# Patient Record
Sex: Female | Born: 1985
Health system: Southern US, Community
[De-identification: ages and names within clinical notes are randomized; demographics above are authoritative.]

## PROBLEM LIST (undated history)

## (undated) DIAGNOSIS — F419 Anxiety disorder, unspecified: Secondary | ICD-10-CM

## (undated) DIAGNOSIS — F319 Bipolar disorder, unspecified: Secondary | ICD-10-CM

## (undated) DIAGNOSIS — F429 Obsessive-compulsive disorder, unspecified: Secondary | ICD-10-CM

## (undated) DIAGNOSIS — Z789 Other specified health status: Secondary | ICD-10-CM

## (undated) DIAGNOSIS — F32A Depression, unspecified: Secondary | ICD-10-CM

## (undated) DIAGNOSIS — F329 Major depressive disorder, single episode, unspecified: Secondary | ICD-10-CM

## (undated) HISTORY — PX: BREAST ENHANCEMENT SURGERY: SHX7

## (undated) HISTORY — DX: Bipolar disorder, unspecified: F31.9

---

## 2006-05-26 ENCOUNTER — Other Ambulatory Visit: Admission: RE | Admit: 2006-05-26 | Discharge: 2006-05-26 | Payer: Self-pay | Admitting: Obstetrics and Gynecology

## 2006-11-18 ENCOUNTER — Ambulatory Visit (HOSPITAL_COMMUNITY): Admission: RE | Admit: 2006-11-18 | Discharge: 2006-11-18 | Payer: Self-pay | Admitting: Obstetrics and Gynecology

## 2006-12-29 ENCOUNTER — Inpatient Hospital Stay (HOSPITAL_COMMUNITY): Admission: AD | Admit: 2006-12-29 | Discharge: 2006-12-29 | Payer: Self-pay | Admitting: Obstetrics & Gynecology

## 2007-01-07 ENCOUNTER — Inpatient Hospital Stay (HOSPITAL_COMMUNITY): Admission: AD | Admit: 2007-01-07 | Discharge: 2007-01-10 | Payer: Self-pay | Admitting: Obstetrics and Gynecology

## 2009-05-17 ENCOUNTER — Other Ambulatory Visit: Admission: RE | Admit: 2009-05-17 | Discharge: 2009-05-17 | Payer: Self-pay | Admitting: Obstetrics and Gynecology

## 2010-04-17 ENCOUNTER — Inpatient Hospital Stay (HOSPITAL_COMMUNITY): Admission: AD | Admit: 2010-04-17 | Discharge: 2010-04-17 | Payer: Self-pay | Admitting: Obstetrics and Gynecology

## 2010-04-25 ENCOUNTER — Inpatient Hospital Stay (HOSPITAL_COMMUNITY)
Admission: AD | Admit: 2010-04-25 | Discharge: 2010-04-25 | Payer: Self-pay | Source: Home / Self Care | Admitting: Obstetrics & Gynecology

## 2010-06-03 ENCOUNTER — Inpatient Hospital Stay (HOSPITAL_COMMUNITY)
Admission: AD | Admit: 2010-06-03 | Discharge: 2010-06-03 | Payer: Self-pay | Source: Home / Self Care | Admitting: Obstetrics and Gynecology

## 2010-06-27 ENCOUNTER — Inpatient Hospital Stay (HOSPITAL_COMMUNITY)
Admission: AD | Admit: 2010-06-27 | Discharge: 2010-06-28 | Payer: Self-pay | Source: Home / Self Care | Attending: Family Medicine | Admitting: Family Medicine

## 2010-09-08 LAB — CBC
HCT: 29.7 % — ABNORMAL LOW (ref 36.0–46.0)
Hemoglobin: 9.6 g/dL — ABNORMAL LOW (ref 12.0–15.0)
RBC: 3.65 MIL/uL — ABNORMAL LOW (ref 3.87–5.11)
WBC: 11.6 10*3/uL — ABNORMAL HIGH (ref 4.0–10.5)

## 2010-09-10 LAB — URINALYSIS, ROUTINE W REFLEX MICROSCOPIC
Glucose, UA: NEGATIVE mg/dL
Protein, ur: NEGATIVE mg/dL
Specific Gravity, Urine: 1.025 (ref 1.005–1.030)
pH: 6.5 (ref 5.0–8.0)

## 2010-09-10 LAB — URINE MICROSCOPIC-ADD ON

## 2010-09-10 LAB — URINE CULTURE: Colony Count: 2000

## 2010-09-10 LAB — WET PREP, GENITAL
Clue Cells Wet Prep HPF POC: NONE SEEN
Trich, Wet Prep: NONE SEEN

## 2010-11-11 NOTE — Consult Note (Signed)
NAMEMarland Kitchen  Alexis, [name]           ACCOUNT NO.:  [id number]   MEDICAL RECORD NO.:  [id number]          PATIENT TYPE:  MAT   LOCATION:  MATC                          FACILITY:  [facility]   PHYSICIAN:  Lazaro Arms, M.D.   DATE OF BIRTH:  03-26-1986   DATE OF CONSULTATION:  DATE OF DISCHARGE:  12/29/2006                                 CONSULTATION   Alexis [name] is a 25 year old white female, gravida 1, para 0, estimated  delivery of January 05, 2007 currently at [redacted] weeks gestation who presented  to the MAU complaining of dribbling fluid.  She did not have a big gush,  it started this evening.  Has had no notable uterine contractions, no  bleeding, the baby has been moving well for her.  On evaluation at the  MAU, she has dry peroneum, I had her cough several times and there was  no gush of fluid.  I did a sterile speculum exam and again visually  there was pooling again with coughing and pressure.  The amnio swab was  negative and it was Fern negative.  Her cervix was the same as it was  when I checked her in the office the other day which was a fingertip,  about 25% effaced, very posterior and -1 station vertex.  But very soft  and malleable.  External fetal monitor reveals a reactive NST with  minimal uterine activity, just a little bit of irritability which she is  not aware of.  Her blood pressure is a little elevated at 144/76, pulse  of 110.  We are rechecking that prior to discharge.  Otherwise she is  given routine labor instructions and precautions.  We talked about  rupture of membranes and she is encouraged to keep her appointment next  Tuesday as scheduled.  She is to call us if she has any questions in the  meantime.      Lazaro Arms, M.D.  Electronically Signed     LHE/MEDQ  D:  12/29/2006  T:  12/30/2006  Job:  [id number]

## 2010-11-11 NOTE — Op Note (Signed)
NAMEJAQUANA, GEIGER           ACCOUNT NO.:  [id number]   MEDICAL RECORD NO.:  [id number]          PATIENT TYPE:  INP   LOCATION:  9112                          [facility]ACILITY:  [facility]   PHYSICIAN:  Tilda Burrow, M.D. DATE OF BIRTH:  31-Oct-1985   DATE OF PROCEDURE:  01/08/2007  DATE OF DISCHARGE:                               OPERATIVE REPORT   [name] was brought in for induction of labor at 9 p.m., admitted,  _foley bulb___  placed; which was taken out at 3 a.m. and membranes  ruptured.  She was begun on Pitocin and progressed to completely dilated  within 2-1/2 hours at 5:40 a.m.  Delivered after a 22 minute second-  stage, delivering a healthy 7 pound 9 ounce female infant (3445 gram.  Apgars 7 and 9, over an first-degree perineal laceration where the hymen  remnant was disrupted; but otherwise the perineum was intact.  The cord  was clamped.  The infant placed on the perineal abdomen and then taken  to the warmer.  Amniotic fluid was clear without malodor.  The patient  had placenta delivered easily.      Tilda Burrow, M.D.  Electronically Signed     JVF/MEDQ  D:  01/08/2007  T:  01/09/2007  Job:  [id number]

## 2010-11-11 NOTE — H&P (Signed)
NAMESANJUANA, Alexis [name]           ACCOUNT NO.:  [id number]   MEDICAL RECORD NO.:  [id number]          PATIENT TYPE:  INP   LOCATION:  9112                          [facility]ILITY:  [facility]   PHYSICIAN:  Tilda Burrow, M.D. DATE OF BIRTH:  October 04, 1985   DATE OF ADMISSION:  01/07/2007  DATE OF DISCHARGE:                              HISTORY & PHYSICAL   January 07, 2007, 9 p.m.   ADMITTING DIAGNOSES:  1. Pregnancy, 40 and 1/[redacted] weeks gestation.  2. Cervical favorability.  3. Elective induction of labor for social reasons.   HPI:  This 25 year old primiparous female is now 40 weeks 2 days by best  criteria and has cervical favorability at 3 cm, 50%, -2, with a  contraction at 2 cm and posterior between contractions.  She is admitted  at this time for induction of labor as per vigorous patient request due  to academic requirements over the next 2 weeks, final exams, etc.  The  patient understands the risk of induced labors include fetal distress  and need for emergency delivery with similar risks to those accomplished  with spontaneous labors.  Prenatal course has been followed through  [facility] with blood type O positive, urine drug screen  negative, Varicella zoster antibody noted present, rubella immunity  present, hemoglobin 14, hematocrit 41.  Hepatitis, HIV, RPR, GC and  Chlamydia all negative.  Group B Strep negative.  Glucose tolerance test  borderline at 148 mg% with normal 3-hour test.  She is negative for  cystic fibrosis, she desires epidural, has a female infant anticipated.   PHYSICAL EXAM:  A healthy, slim, calm Caucasian female alert, oriented  x3.  Pupils equal, reactive.  NECK:  Supple.  CARDIOVASCULAR:  Unremarkable.  FUNDAL HEIGHT:  Is 35-36 cm.  ESTIMATED FETAL WEIGHT:  Is 6-1/2 pounds.  CERVIX:  Is 3 cm, 50%, -2 with contraction.   PLAN:  Foley bulb for 3-4 hours, until 3 a.m., and vaginal delivery  anticipated after Pitocin induction of labor.      Tilda Burrow, M.D.  Electronically Signed     JVF/MEDQ  D:  01/08/2007  T:  01/09/2007  Job:  [id number]

## 2011-02-12 ENCOUNTER — Inpatient Hospital Stay (INDEPENDENT_AMBULATORY_CARE_PROVIDER_SITE_OTHER)
Admission: RE | Admit: 2011-02-12 | Discharge: 2011-02-12 | Disposition: A | Discharge status: Home / Self Care | Payer: Commercial Managed Care - PPO | Source: Ambulatory Visit | Attending: Emergency Medicine | Admitting: Emergency Medicine

## 2011-02-12 DIAGNOSIS — R112 Nausea with vomiting, unspecified: Secondary | ICD-10-CM

## 2011-04-14 LAB — CBC
Hemoglobin: 11.4 — ABNORMAL LOW
MCV: 92.6
Platelets: 152
Platelets: 171
RDW: 13
WBC: 9.3

## 2011-04-14 LAB — RPR: RPR Ser Ql: NONREACTIVE

## 2011-06-30 NOTE — L&D Delivery Note (Signed)
Delivery Note At 3:04 PM a viable and healthy female was delivered via  (Presentation: LOA.  APGAR: pending , ; weight pending .   Placenta status: , .  Cord:  with the following complications: .  Cord pH: n/a  Anesthesia:  Epidural Episiotomy: None Lacerations: None Suture Repair: N/A Est. Blood Loss (mL): 350  Mom to postpartum.  Baby to nursery-stable.  [facility] 02/17/2012, 3:27 PM

## 2011-07-15 ENCOUNTER — Other Ambulatory Visit (HOSPITAL_COMMUNITY)
Admission: RE | Admit: 2011-07-15 | Discharge: 2011-07-15 | Disposition: A | Discharge status: Home / Self Care | Payer: Commercial Managed Care - PPO | Source: Ambulatory Visit | Attending: Obstetrics and Gynecology | Admitting: Obstetrics and Gynecology

## 2011-07-15 DIAGNOSIS — Z113 Encounter for screening for infections with a predominantly sexual mode of transmission: Secondary | ICD-10-CM | POA: Insufficient documentation

## 2011-07-15 DIAGNOSIS — Z01419 Encounter for gynecological examination (general) (routine) without abnormal findings: Secondary | ICD-10-CM | POA: Insufficient documentation

## 2011-07-15 LAB — OB RESULTS CONSOLE HEPATITIS B SURFACE ANTIGEN: Hepatitis B Surface Ag: NEGATIVE

## 2011-07-15 LAB — OB RESULTS CONSOLE ABO/RH

## 2011-07-15 LAB — OB RESULTS CONSOLE ANTIBODY SCREEN: Antibody Screen: NEGATIVE

## 2011-07-15 LAB — OB RESULTS CONSOLE RUBELLA ANTIBODY, IGM: Rubella: IMMUNE

## 2011-07-15 LAB — OB RESULTS CONSOLE GC/CHLAMYDIA: Gonorrhea: NEGATIVE

## 2011-12-28 ENCOUNTER — Telehealth (HOSPITAL_COMMUNITY): Payer: Self-pay

## 2011-12-29 ENCOUNTER — Ambulatory Visit (HOSPITAL_COMMUNITY): Payer: Commercial Managed Care - PPO | Admitting: Licensed Clinical Social Worker

## 2012-02-10 LAB — OB RESULTS CONSOLE GBS: GBS: NEGATIVE

## 2012-02-17 ENCOUNTER — Inpatient Hospital Stay (HOSPITAL_COMMUNITY)
Admission: AD | Admit: 2012-02-17 | Discharge: 2012-02-18 | DRG: 767 | Disposition: A | Discharge status: Home / Self Care | Payer: 59 | Source: Ambulatory Visit | Attending: Obstetrics & Gynecology | Admitting: Obstetrics & Gynecology

## 2012-02-17 ENCOUNTER — Encounter (HOSPITAL_COMMUNITY): Payer: Self-pay | Admitting: *Deleted

## 2012-02-17 ENCOUNTER — Encounter (HOSPITAL_COMMUNITY): Payer: Self-pay | Admitting: Anesthesiology

## 2012-02-17 ENCOUNTER — Inpatient Hospital Stay (HOSPITAL_COMMUNITY): Payer: 59 | Admitting: Anesthesiology

## 2012-02-17 DIAGNOSIS — Z302 Encounter for sterilization: Secondary | ICD-10-CM

## 2012-02-17 DIAGNOSIS — Z3490 Encounter for supervision of normal pregnancy, unspecified, unspecified trimester: Secondary | ICD-10-CM

## 2012-02-17 HISTORY — DX: Other specified health status: Z78.9

## 2012-02-17 LAB — CBC
HCT: 30.7 % — ABNORMAL LOW (ref 36.0–46.0)
MCHC: 31.3 g/dL (ref 30.0–36.0)
RDW: 15.2 % (ref 11.5–15.5)

## 2012-02-17 MED ORDER — METOCLOPRAMIDE HCL 10 MG PO TABS
10.0000 mg | ORAL_TABLET | Freq: Once | ORAL | Status: AC
  Administered 2012-02-18: 10 mg via ORAL
  Filled 2012-02-17: qty 1

## 2012-02-17 MED ORDER — PRENATAL MULTIVITAMIN CH
1.0000 | ORAL_TABLET | Freq: Every day | ORAL | Status: DC
  Filled 2012-02-17: qty 1

## 2012-02-17 MED ORDER — PHENYLEPHRINE 40 MCG/ML (10ML) SYRINGE FOR IV PUSH (FOR BLOOD PRESSURE SUPPORT): 80.0000 ug | PREFILLED_SYRINGE | INTRAVENOUS | Status: DC | PRN

## 2012-02-17 MED ORDER — DIPHENHYDRAMINE HCL 50 MG/ML IJ SOLN: 12.5000 mg | INTRAMUSCULAR | Status: DC | PRN

## 2012-02-17 MED ORDER — IBUPROFEN 600 MG PO TABS
600.0000 mg | ORAL_TABLET | Freq: Four times a day (QID) | ORAL | Status: DC
  Administered 2012-02-17 – 2012-02-18 (×4): 600 mg via ORAL
  Filled 2012-02-17 (×4): qty 1

## 2012-02-17 MED ORDER — ONDANSETRON HCL 4 MG/2ML IJ SOLN: 4.0000 mg | Freq: Four times a day (QID) | INTRAMUSCULAR | Status: DC | PRN

## 2012-02-17 MED ORDER — LACTATED RINGERS IV SOLN
500.0000 mL | Freq: Once | INTRAVENOUS | Status: AC
  Administered 2012-02-17: 500 mL via INTRAVENOUS

## 2012-02-17 MED ORDER — SIMETHICONE 80 MG PO CHEW: 80.0000 mg | CHEWABLE_TABLET | ORAL | Status: DC | PRN

## 2012-02-17 MED ORDER — PHENYLEPHRINE 40 MCG/ML (10ML) SYRINGE FOR IV PUSH (FOR BLOOD PRESSURE SUPPORT)
80.0000 ug | PREFILLED_SYRINGE | INTRAVENOUS | Status: DC | PRN
  Filled 2012-02-17: qty 5

## 2012-02-17 MED ORDER — FLEET ENEMA 7-19 GM/118ML RE ENEM: 1.0000 | ENEMA | RECTAL | Status: DC | PRN

## 2012-02-17 MED ORDER — FENTANYL CITRATE 0.05 MG/ML IJ SOLN: 100.0000 ug | INTRAMUSCULAR | Status: DC | PRN

## 2012-02-17 MED ORDER — SENNOSIDES-DOCUSATE SODIUM 8.6-50 MG PO TABS
2.0000 | ORAL_TABLET | Freq: Every day | ORAL | Status: DC
  Administered 2012-02-17: 2 via ORAL

## 2012-02-17 MED ORDER — EPHEDRINE 5 MG/ML INJ
10.0000 mg | INTRAVENOUS | Status: DC | PRN
  Administered 2012-02-17: 10 mg via INTRAVENOUS
  Filled 2012-02-17 (×2): qty 4

## 2012-02-17 MED ORDER — EPHEDRINE 5 MG/ML INJ: 10.0000 mg | INTRAVENOUS | Status: DC | PRN

## 2012-02-17 MED ORDER — SODIUM BICARBONATE 8.4 % IV SOLN
INTRAVENOUS | Status: DC | PRN
  Administered 2012-02-17: 4 mL via EPIDURAL

## 2012-02-17 MED ORDER — OXYTOCIN BOLUS FROM INFUSION
250.0000 mL | Freq: Once | INTRAVENOUS | Status: AC
  Administered 2012-02-17: 250 mL via INTRAVENOUS
  Filled 2012-02-17: qty 500

## 2012-02-17 MED ORDER — DIBUCAINE 1 % RE OINT: 1.0000 "application " | TOPICAL_OINTMENT | RECTAL | Status: DC | PRN

## 2012-02-17 MED ORDER — WITCH HAZEL-GLYCERIN EX PADS: 1.0000 "application " | MEDICATED_PAD | CUTANEOUS | Status: DC | PRN

## 2012-02-17 MED ORDER — ACETAMINOPHEN 325 MG PO TABS: 650.0000 mg | ORAL_TABLET | ORAL | Status: DC | PRN

## 2012-02-17 MED ORDER — ONDANSETRON HCL 4 MG/2ML IJ SOLN: 4.0000 mg | INTRAMUSCULAR | Status: DC | PRN

## 2012-02-17 MED ORDER — IBUPROFEN 600 MG PO TABS: 600.0000 mg | ORAL_TABLET | Freq: Four times a day (QID) | ORAL | Status: DC | PRN

## 2012-02-17 MED ORDER — LACTATED RINGERS IV SOLN
INTRAVENOUS | Status: DC
Start: 2012-02-17 — End: 2012-02-17
  Administered 2012-02-17 (×3): via INTRAVENOUS

## 2012-02-17 MED ORDER — OXYTOCIN 40 UNITS IN LACTATED RINGERS INFUSION - SIMPLE MED
62.5000 mL/h | Freq: Once | INTRAVENOUS | Status: DC
  Filled 2012-02-17: qty 1000

## 2012-02-17 MED ORDER — ZOLPIDEM TARTRATE 5 MG PO TABS: 5.0000 mg | ORAL_TABLET | Freq: Every evening | ORAL | Status: DC | PRN

## 2012-02-17 MED ORDER — CITRIC ACID-SODIUM CITRATE 334-500 MG/5ML PO SOLN: 30.0000 mL | ORAL | Status: DC | PRN

## 2012-02-17 MED ORDER — TETANUS-DIPHTH-ACELL PERTUSSIS 5-2.5-18.5 LF-MCG/0.5 IM SUSP: 0.5000 mL | Freq: Once | INTRAMUSCULAR | Status: DC

## 2012-02-17 MED ORDER — OXYTOCIN 40 UNITS IN LACTATED RINGERS INFUSION - SIMPLE MED
1.0000 m[IU]/min | INTRAVENOUS | Status: DC
  Administered 2012-02-17: 2 m[IU]/min via INTRAVENOUS

## 2012-02-17 MED ORDER — PRENATAL MULTIVITAMIN CH: 1.0000 | ORAL_TABLET | Freq: Every day | ORAL | Status: DC

## 2012-02-17 MED ORDER — FENTANYL 2.5 MCG/ML BUPIVACAINE 1/10 % EPIDURAL INFUSION (WH - ANES)
14.0000 mL/h | INTRAMUSCULAR | Status: DC
  Administered 2012-02-17: 14 mL/h via EPIDURAL
  Filled 2012-02-17 (×2): qty 60

## 2012-02-17 MED ORDER — LIDOCAINE HCL (PF) 1 % IJ SOLN
30.0000 mL | INTRAMUSCULAR | Status: DC | PRN
  Filled 2012-02-17: qty 30

## 2012-02-17 MED ORDER — OXYCODONE-ACETAMINOPHEN 5-325 MG PO TABS: 1.0000 | ORAL_TABLET | ORAL | Status: DC | PRN

## 2012-02-17 MED ORDER — FENTANYL 2.5 MCG/ML BUPIVACAINE 1/10 % EPIDURAL INFUSION (WH - ANES)
INTRAMUSCULAR | Status: DC | PRN
  Administered 2012-02-17: 14 mL/h via EPIDURAL

## 2012-02-17 MED ORDER — LACTATED RINGERS IV SOLN
INTRAVENOUS | Status: DC
  Administered 2012-02-18: 10:00:00 via INTRAVENOUS
  Administered 2012-02-18: 10 mL/h via INTRAVENOUS

## 2012-02-17 MED ORDER — DIPHENHYDRAMINE HCL 25 MG PO CAPS: 25.0000 mg | ORAL_CAPSULE | Freq: Four times a day (QID) | ORAL | Status: DC | PRN

## 2012-02-17 MED ORDER — FAMOTIDINE 20 MG PO TABS
40.0000 mg | ORAL_TABLET | Freq: Once | ORAL | Status: AC
  Administered 2012-02-18: 40 mg via ORAL
  Filled 2012-02-17: qty 2

## 2012-02-17 MED ORDER — ONDANSETRON HCL 4 MG PO TABS: 4.0000 mg | ORAL_TABLET | ORAL | Status: DC | PRN

## 2012-02-17 MED ORDER — LACTATED RINGERS IV SOLN
500.0000 mL | INTRAVENOUS | Status: DC | PRN
  Administered 2012-02-17: 500 mL via INTRAVENOUS

## 2012-02-17 MED ORDER — BENZOCAINE-MENTHOL 20-0.5 % EX AERO
1.0000 "application " | INHALATION_SPRAY | CUTANEOUS | Status: DC | PRN
  Administered 2012-02-17: 1 via TOPICAL
  Filled 2012-02-17: qty 56

## 2012-02-17 MED ORDER — LANOLIN HYDROUS EX OINT: TOPICAL_OINTMENT | CUTANEOUS | Status: DC | PRN

## 2012-02-17 MED ORDER — TERBUTALINE SULFATE 1 MG/ML IJ SOLN: 0.2500 mg | Freq: Once | INTRAMUSCULAR | Status: DC | PRN

## 2012-02-17 MED ORDER — OXYCODONE-ACETAMINOPHEN 5-325 MG PO TABS
1.0000 | ORAL_TABLET | ORAL | Status: DC | PRN
  Administered 2012-02-18: 1 via ORAL
  Filled 2012-02-17: qty 1

## 2012-02-17 NOTE — Progress Notes (Signed)
Pt not wanting to change positions at this time. Encouraged side to side.

## 2012-02-17 NOTE — Progress Notes (Signed)
Provider aware of low blood pressure, pt reports feels much better. BP cuff on upper arm giving low reading.

## 2012-02-17 NOTE — Progress Notes (Signed)
This note also relates to the following rows which could not be included: Dose (milli-units/min) Oxytocin - Cannot attach notes to extension rows Rate (mL/hr) Oxytocin - Cannot attach notes to extension rows Concentration Oxytocin - Cannot attach notes to extension rows    

## 2012-02-17 NOTE — Progress Notes (Signed)
efm off

## 2012-02-17 NOTE — Progress Notes (Signed)
Pt states that she feels better on side. No other symptoms.

## 2012-02-17 NOTE — H&P (Signed)
  Chief Complaint:  Labor Eval   Alexis [name] is  26 y.o. [id number] at [redacted]w[redacted]d presents complaining of ctx that woke her up this morning. She reports that the ctx began yesterday and were 20 minutes apart but are much more frequent and painful this morning. She states ctx are associated with none vaginal bleeding, intact membranes, along with active fetal movement.   Obstetrical/Gynecological History: Menstrual History: OB History   Grav Para Term Preterm Abortions TAB SAB Ect Mult Living  3 2 2  0 0  0   2     No LMP recorded. Patient is pregnant.     Past Medical History: Past Medical History Diagnosis Date . No pertinent past medical history    Past Surgical History: Past Surgical History Procedure Date . No past surgeries    Family History: No family history on file.  Social History: History Substance Use Topics . Smoking status: Not on file . Smokeless tobacco: Not on file . Alcohol Use:    Allergies: No Known Allergies  Meds:  No prescriptions prior to admission   Review of Systems - Please refer to the aforementioned patients' reports.     Physical Exam Blood pressure 114/66, pulse 88, temperature 97.8 F (36.6 C), temperature source Oral, resp. rate 18, SpO2 100.00%. GENERAL: Well-developed, well-nourished female in no acute distress.  LUNGS: Clear to auscultation bilaterally.  HEART: Regular rate and rhythm. ABDOMEN: Soft, nontender, nondistended, gravid.  EXTREMITIES: Nontender, no edema, 2+ distal pulses. CERVICAL EXAM: Dilatation 2cm   Effacement 80%   Station -1   Presentation: cephalic FHT:  Baseline rate 130 bpm   Variability moderate  Accelerations present   Decelerations none Contractions: Every 4-7 mins   Labs: OPOS Antibody-NEG Rubella-immune RPR-NR HepB-NEG GC-NEG CML-NEG GBS-NEG I  Assessment: Alexis [name] is  26 y.o. [id number] at [redacted]w[redacted]d presents with regular ctx.  Plan: IUP@[redacted]w[redacted]d  Latent labor vs. False  labor Monitor, ambulate, recheck in 1-2 hours.  Addendum: After walking for an hour pt reports more frequent more painful ctx. SVE 4-5/80/-1 Active labor, admit, efm per unit policy, epidural prn, anticipate SVD.  Lawernce Pitts 8/21/20135:11 AM

## 2012-02-17 NOTE — Progress Notes (Signed)
Difficult to trace contractions, pt on side, will cont to assess.

## 2012-02-17 NOTE — Anesthesia Preprocedure Evaluation (Signed)

## 2012-02-17 NOTE — H&P (Signed)
Attestation of Attending Supervision of Advanced Practitioner (CNM/NP): Evaluation and management procedures were performed by the Advanced Practitioner under my supervision and collaboration.  I have reviewed the Advanced Practitioner's note and chart, and I agree with the management and plan.  Luiz Trumpower, MD, FACOG Attending Obstetrician & Gynecologist Faculty Practice, [facility]  

## 2012-02-17 NOTE — Progress Notes (Signed)
Pt reports ringing in ears, nausea, light headedness. Changed positions, and cont to monitor BP, WNL.

## 2012-02-17 NOTE — Progress Notes (Signed)
BP cuff changed to lower arm. Pt reports feels fine.

## 2012-02-17 NOTE — Progress Notes (Signed)
   Subjective: Pt reports comfortable with epidural.  Objective: BP 107/72  Pulse 87  Temp 98.2 F (36.8 C) (Oral)  Resp 18  Ht 5\' 6"  (1.676 m)  Wt 65.772 kg (145 lb)  BMI 23.40 kg/m2  SpO2 100%      FHT:  FHR: 140's bpm, variability: moderate,  accelerations:  Present,  decelerations:  Absent UC:   Poor tracing, readjust monitor SVE:   Dilation: 5 Effacement (%): 90 Station: -1 Exam by:: Cleone Slim RN   Labs: Lab Results  Component Value Date   WBC 11.0* 02/17/2012   HGB 9.6* 02/17/2012   HCT 30.7* 02/17/2012   MCV 80.4 02/17/2012   PLT 147* 02/17/2012    Assessment / Plan: Spontaneous labor, progressing normally  Labor: Progressing normally Preeclampsia:  n/a Fetal Wellbeing:  Category I Pain Control:  Epidural I/D:  n/a Anticipated MOD:  NSVD  Pt declined any augmentation at this time.  Recheck in two hours, if no change open to augmentation at that time.  [facility] 02/17/2012, 9:52 AM

## 2012-02-17 NOTE — Progress Notes (Signed)
   Subjective: Pt reports increased vaginal pressure.    Objective: BP 93/55  Pulse 70  Temp 98.4 F (36.9 C) (Oral)  Resp 18  Ht 5\' 6"  (1.676 m)  Wt 65.772 kg (145 lb)  BMI 23.40 kg/m2  SpO2 100%      FHT:  FHR: 120's bpm, variability: moderate,  accelerations:  Present,  decelerations:  Absent UC:   regular, every 2-3 minutes SVE:   Dilation: 9 Effacement (%): 100 Station: 0 Exam by:: Belenda Cruise  RN   Labs: Lab Results  Component Value Date   WBC 11.0* 02/17/2012   HGB 9.6* 02/17/2012   HCT 30.7* 02/17/2012   MCV 80.4 02/17/2012   PLT 147* 02/17/2012    Assessment / Plan: Augmentation of labor, progressing well  Labor: Progressing normally Preeclampsia:  n/a Fetal Wellbeing:  Category I Pain Control:  Epidural I/D:  n/a Anticipated MOD:  NSVD  [facility] 02/17/2012, 2:44 PM

## 2012-02-17 NOTE — MAU Note (Signed)
C/o ucs since 2300. 

## 2012-02-17 NOTE — Progress Notes (Signed)
Maternal heart rate noted while leaning forward.

## 2012-02-17 NOTE — Anesthesia Procedure Notes (Signed)

## 2012-02-17 NOTE — Progress Notes (Signed)
   Subjective: Pt reports continued comfort with contractions.  No questions or concerns.  Objective: BP 94/52  Pulse 97  Temp 98.4 F (36.9 C) (Oral)  Resp 18  Ht 5\' 6"  (1.676 m)  Wt 65.772 kg (145 lb)  BMI 23.40 kg/m2  SpO2 100%      FHT:  FHR: 120's bpm, variability: moderate,  accelerations:  Present,  decelerations:  Absent UC:   Poor tracing, continue readjusting toco SVE:   Dilation: 5.5 Effacement (%): 90 Station: 0 Exam by:: Belenda Cruise  RN   Labs: Lab Results  Component Value Date   WBC 11.0* 02/17/2012   HGB 9.6* 02/17/2012   HCT 30.7* 02/17/2012   MCV 80.4 02/17/2012   PLT 147* 02/17/2012    Assessment / Plan: Augmentation of labor, progressing well  Labor: Progressing normally Preeclampsia:  n/a Fetal Wellbeing:  Category I Pain Control:  Epidural I/D:  n/a Anticipated MOD:  NSVD  Consider AROM with next assessment with placement of IUPC  [facility] 02/17/2012, 12:38 PM

## 2012-02-18 ENCOUNTER — Encounter (HOSPITAL_COMMUNITY): Payer: Self-pay | Admitting: Anesthesiology

## 2012-02-18 ENCOUNTER — Inpatient Hospital Stay (HOSPITAL_COMMUNITY): Payer: 59 | Admitting: Anesthesiology

## 2012-02-18 ENCOUNTER — Encounter (HOSPITAL_COMMUNITY)
Admission: AD | Disposition: A | Discharge status: Home / Self Care | Payer: Self-pay | Source: Ambulatory Visit | Attending: Obstetrics & Gynecology

## 2012-02-18 DIAGNOSIS — Z3490 Encounter for supervision of normal pregnancy, unspecified, unspecified trimester: Secondary | ICD-10-CM

## 2012-02-18 DIAGNOSIS — Z302 Encounter for sterilization: Secondary | ICD-10-CM

## 2012-02-18 HISTORY — PX: TUBAL LIGATION: SHX77

## 2012-02-18 LAB — SURGICAL PCR SCREEN: Staphylococcus aureus: POSITIVE — AB

## 2012-02-18 SURGERY — LIGATION, FALLOPIAN TUBE, POSTPARTUM
Anesthesia: Epidural | Laterality: Bilateral | Wound class: Clean

## 2012-02-18 MED ORDER — FERROUS SULFATE 325 (65 FE) MG PO TABS: 325.0000 mg | ORAL_TABLET | Freq: Three times a day (TID) | ORAL | Status: DC

## 2012-02-18 MED ORDER — CHLORHEXIDINE GLUCONATE CLOTH 2 % EX PADS
6.0000 | MEDICATED_PAD | Freq: Every day | CUTANEOUS | Status: DC
  Administered 2012-02-18: 6 via TOPICAL

## 2012-02-18 MED ORDER — SODIUM BICARBONATE 8.4 % IV SOLN
INTRAVENOUS | Status: DC | PRN
  Administered 2012-02-18: 2 mL via EPIDURAL

## 2012-02-18 MED ORDER — BUPIVACAINE HCL (PF) 0.5 % IJ SOLN
INTRAMUSCULAR | Status: AC
  Filled 2012-02-18: qty 330

## 2012-02-18 MED ORDER — ONDANSETRON HCL 4 MG/2ML IJ SOLN
INTRAMUSCULAR | Status: AC
  Filled 2012-02-18: qty 2

## 2012-02-18 MED ORDER — KETOROLAC TROMETHAMINE 30 MG/ML IJ SOLN
15.0000 mg | Freq: Once | INTRAMUSCULAR | Status: AC | PRN
  Administered 2012-02-18: 30 mg via INTRAVENOUS

## 2012-02-18 MED ORDER — MUPIROCIN 2 % EX OINT
1.0000 "application " | TOPICAL_OINTMENT | Freq: Two times a day (BID) | CUTANEOUS | Status: DC
  Administered 2012-02-18 (×2): 1 via NASAL
  Filled 2012-02-18: qty 22

## 2012-02-18 MED ORDER — PRENATAL MULTIVITAMIN CH: 1.0000 | ORAL_TABLET | Freq: Every day | ORAL | Status: DC

## 2012-02-18 MED ORDER — 0.9 % SODIUM CHLORIDE (POUR BTL) OPTIME
TOPICAL | Status: DC | PRN
  Administered 2012-02-18: 1000 mL

## 2012-02-18 MED ORDER — SODIUM BICARBONATE 8.4 % IV SOLN
INTRAVENOUS | Status: AC
  Filled 2012-02-18: qty 50

## 2012-02-18 MED ORDER — LIDOCAINE-EPINEPHRINE (PF) 2 %-1:200000 IJ SOLN
INTRAMUSCULAR | Status: AC
  Filled 2012-02-18: qty 20

## 2012-02-18 MED ORDER — BUPIVACAINE HCL (PF) 0.5 % IJ SOLN
INTRAMUSCULAR | Status: DC | PRN
  Administered 2012-02-18: 7 mL
  Administered 2012-02-18: 1 mL

## 2012-02-18 MED ORDER — MIDAZOLAM HCL 5 MG/5ML IJ SOLN
INTRAMUSCULAR | Status: DC | PRN
  Administered 2012-02-18 (×2): 1 mg via INTRAVENOUS

## 2012-02-18 MED ORDER — FENTANYL CITRATE 0.05 MG/ML IJ SOLN: 25.0000 ug | INTRAMUSCULAR | Status: DC | PRN

## 2012-02-18 MED ORDER — MIDAZOLAM HCL 2 MG/2ML IJ SOLN
INTRAMUSCULAR | Status: AC
  Filled 2012-02-18: qty 2

## 2012-02-18 MED ORDER — FENTANYL CITRATE 0.05 MG/ML IJ SOLN
INTRAMUSCULAR | Status: DC | PRN
  Administered 2012-02-18 (×2): 50 ug via INTRAVENOUS

## 2012-02-18 MED ORDER — FENTANYL CITRATE 0.05 MG/ML IJ SOLN
INTRAMUSCULAR | Status: AC
  Filled 2012-02-18: qty 2

## 2012-02-18 MED ORDER — IBUPROFEN 600 MG PO TABS: 600.0000 mg | ORAL_TABLET | Freq: Four times a day (QID) | ORAL | Status: AC | PRN

## 2012-02-18 MED ORDER — IBUPROFEN 600 MG PO TABS: 600.0000 mg | ORAL_TABLET | Freq: Four times a day (QID) | ORAL | Status: DC | PRN

## 2012-02-18 MED ORDER — SENNOSIDES-DOCUSATE SODIUM 8.6-50 MG PO TABS: 2.0000 | ORAL_TABLET | Freq: Every day | ORAL | Status: DC

## 2012-02-18 MED ORDER — KETOROLAC TROMETHAMINE 30 MG/ML IJ SOLN
INTRAMUSCULAR | Status: AC
  Administered 2012-02-18: 30 mg via INTRAVENOUS
  Filled 2012-02-18: qty 1

## 2012-02-18 MED ORDER — OXYCODONE-ACETAMINOPHEN 5-325 MG PO TABS: 1.0000 | ORAL_TABLET | Freq: Four times a day (QID) | ORAL | Status: AC | PRN

## 2012-02-18 SURGICAL SUPPLY — 20 items
BANDAGE ADHESIVE 1X3 (GAUZE/BANDAGES/DRESSINGS) ×1 IMPLANT
CHLORAPREP W/TINT 26ML (MISCELLANEOUS) ×2 IMPLANT
CLIP FILSHIE TUBAL LIGA STRL (Clip) ×1 IMPLANT
CLOTH BEACON ORANGE TIMEOUT ST (SAFETY) ×2 IMPLANT
GLOVE BIO SURGEON STRL SZ 6.5 (GLOVE) ×4 IMPLANT
GLOVE BIOGEL PI IND STRL 7.0 (GLOVE) IMPLANT
GLOVE BIOGEL PI INDICATOR 7.0 (GLOVE) ×1
GLOVE ECLIPSE 6.5 STRL STRAW (GLOVE) ×1 IMPLANT
GOWN PREVENTION PLUS LG XLONG (DISPOSABLE) ×4 IMPLANT
NS IRRIG 1000ML POUR BTL (IV SOLUTION) ×2 IMPLANT
PACK ABDOMINAL MINOR (CUSTOM PROCEDURE TRAY) ×2 IMPLANT
SLEEVE SCD COMPRESS KNEE MED (MISCELLANEOUS) ×1 IMPLANT
STRIP CLOSURE SKIN 1/4X3 (GAUZE/BANDAGES/DRESSINGS) ×2 IMPLANT
SUT CHROMIC 0 CT 1 (SUTURE) IMPLANT
SUT VIC AB 0 CT1 27 (SUTURE) ×2
SUT VIC AB 0 CT1 27XBRD ANBCTR (SUTURE) ×1 IMPLANT
SUT VICRYL 4-0 PS2 18IN ABS (SUTURE) ×2 IMPLANT
TOWEL OR 17X24 6PK STRL BLUE (TOWEL DISPOSABLE) ×4 IMPLANT
TRAY FOLEY CATH 14FR (SET/KITS/TRAYS/PACK) ×2 IMPLANT
WATER STERILE IRR 1000ML POUR (IV SOLUTION) ×2 IMPLANT

## 2012-02-18 NOTE — Anesthesia Postprocedure Evaluation (Signed)
Anesthesia Post Note  Patient: Alexis [name]  Procedure(s) Performed: Procedure(s) (LRB): POST PARTUM TUBAL LIGATION (Bilateral)  Anesthesia type: Epidural  Patient location: PACU  Post pain: Pain level controlled  Post assessment: Post-op Vital signs reviewed  Last Vitals:  Filed Vitals:   02/18/12 1200  BP: 112/67  Pulse: 67  Temp:   Resp: 16    Post vital signs: stable  Level of consciousness: awake  Complications: No apparent anesthesia complications

## 2012-02-18 NOTE — Progress Notes (Signed)
Post Partum Day 1 Subjective: She is a G3P3003 at PPD#1 after a NSVD.  Her pain is improving.  Vaginal bleeding is decreasing.  Breastfeeding.  Scheduled for a BTL at 0930 today.  Objective: Blood pressure 100/79, pulse 70, temperature 98.3 F (36.8 C), temperature source Oral, resp. rate 18, height 5\' 6"  (1.676 m), weight 65.772 kg (145 lb), SpO2 100.00%, unknown if currently breastfeeding.  Physical Exam:  General: alert and no distress Lochia: appropriate Uterine Fundus: firm Incision: n/a DVT Evaluation: No evidence of DVT seen on physical exam. Negative Homan's sign.   Basename 02/17/12 0735  HGB 9.6*  HCT 30.7*    Assessment/Plan: Plan for discharge tomorrow   LOS: 1 day   Alexis [name] 02/18/2012, 7:35 AM

## 2012-02-18 NOTE — Progress Notes (Signed)
Pt left without percocet prescription.  After multiple attempts to contact pt on cell and home phone, Dr. was notified.  Instructed to place script in shred container.

## 2012-02-18 NOTE — Progress Notes (Signed)
I have seen and examined pt and agree with above. Hgb 9.6. Will start FeSO4 at discharge. Pt would like to go home after BTL today if doing well.  Napoleon Form, MD

## 2012-02-18 NOTE — Discharge Summary (Signed)
Obstetric Discharge Summary Reason for Admission: onset of labor Prenatal Procedures: NST Intrapartum Procedures: spontaneous vaginal delivery Postpartum Procedures: P.P. tubal ligation Complications-Operative and Postpartum: none Hemoglobin  Date Value Range Status  02/17/2012 9.6* 12.0 - 15.0 g/dL Final     HCT  Date Value Range Status  02/17/2012 30.7* 36.0 - 46.0 % Final    Physical Exam:  General: alert and cooperative Lochia: appropriate Uterine Fundus: firm Incision: healing well, no significant drainage DVT Evaluation: No evidence of DVT seen on physical exam. No cords or calf tenderness.  Discharge Diagnoses: Term Pregnancy-delivered Alexis [name] 26 y.o. female  now [id number] who at [redacted]w[redacted]d delivered a healthy female via spontaneous vaginal delivery. Patient did well through hospitalization. Breastfeeding with some difficulty so decided to use breast pump. Patient had a bilateral tubal ligation before discharge. Tolerated procedure well and desired to go home.    Discharge Information: Date: 02/18/2012 Activity: pelvic rest Diet: routine Medications: PNV, Ibuprofen, Colace, Iron and Percocet Condition: stable Instructions: refer to practice specific booklet Discharge to: home Follow-up Information    Follow up with Tilda Burrow, MD. Schedule an appointment as soon as possible for a visit in 6 weeks. (for postpartum visit)    Contact information:   [facility] [address] [place] [address] [phone]          Newborn Data: Live born female  Birth Weight: 7 lb 2.3 oz (3240 g) APGAR: 8, 9  Home with mother.  Tana Conch 02/18/2012, 5:32 PM

## 2012-02-18 NOTE — Transfer of Care (Signed)
Immediate Anesthesia Transfer of Care Note  Patient: Alexis [name]  Procedure(s) Performed: Procedure(s) (LRB): POST PARTUM TUBAL LIGATION (Bilateral)  Patient Location: PACU  Anesthesia Type: Epidural  Level of Consciousness: awake, alert  and oriented  Airway & Oxygen Therapy: Patient Spontanous Breathing and Patient connected to nasal cannula oxygen  Post-op Assessment: Report given to PACU RN and Post -op Vital signs reviewed and stable  Post vital signs: Reviewed and stable  Complications: No apparent anesthesia complications

## 2012-02-18 NOTE — H&P (Signed)
26 yo MW P3 (26yo, 20 mo, NB) who would have a PPS. She understands the 0.5% failure rate. She declines alternative forms of birth control.  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 Risks explained, understood, and accepted.

## 2012-02-18 NOTE — Anesthesia Preprocedure Evaluation (Addendum)
Anesthesia Evaluation  Patient identified by MRN, date of birth, ID band Patient awake    Reviewed: Allergy & Precautions, H&P , NPO status , Patient's Chart, lab work & pertinent test results, reviewed documented beta blocker date and time   History of Anesthesia Complications Negative for: history of anesthetic complications  Airway Mallampati: II TM Distance: >3 FB Neck ROM: full    Dental  (+) Teeth Intact   Pulmonary neg pulmonary ROS,  breath sounds clear to auscultation        Cardiovascular negative cardio ROS  Rhythm:regular Rate:Normal     Neuro/Psych negative neurological ROS  negative psych ROS   GI/Hepatic negative GI ROS, Neg liver ROS,   Endo/Other  negative endocrine ROS  Renal/GU negative Renal ROS  negative genitourinary   Musculoskeletal   Abdominal   Peds  Hematology  (+) anemia ,   Anesthesia Other Findings       Reproductive/Obstetrics (+) Pregnancy (s/p SVD at 3 pm 8/21) and Breast feeding                           Anesthesia Physical  Anesthesia Plan  ASA: II  Anesthesia Plan: Epidural   Post-op Pain Management:    Induction:   Airway Management Planned:   Additional Equipment:   Intra-op Plan:   Post-operative Plan:   Informed Consent: I have reviewed the patients History and Physical, chart, labs and discussed the procedure including the risks, benefits and alternatives for the proposed anesthesia with the patient or authorized representative who has indicated his/her understanding and acceptance.   Dental Advisory Given  Plan Discussed with: Surgeon and CRNA  Anesthesia Plan Comments: (Labs checked- platelets confirmed with RN in room. Fetal heart tracing, per RN, reported to be stable enough for sitting procedure. Discussed epidural, and patient consents to the procedure:  included risk of possible headache,backache, failed block, allergic  reaction, and nerve injury. This patient was asked if she had any questions or concerns before the procedure started. )        Anesthesia Quick Evaluation

## 2012-02-19 ENCOUNTER — Encounter (HOSPITAL_COMMUNITY): Payer: Self-pay | Admitting: Obstetrics & Gynecology

## 2012-02-25 ENCOUNTER — Encounter (HOSPITAL_COMMUNITY): Payer: Self-pay | Admitting: Licensed Clinical Social Worker

## 2012-02-25 ENCOUNTER — Ambulatory Visit (HOSPITAL_COMMUNITY): Payer: Commercial Managed Care - PPO | Admitting: Licensed Clinical Social Worker

## 2012-02-25 NOTE — Progress Notes (Signed)
Patient ID: Alexis [name], female   DOB: 1986-03-30, 26 y.o.   MRN: [id number] Patient was a no show/no call for initial appointment.

## 2012-03-01 NOTE — Brief Op Note (Signed)
02/17/2012 - 02/18/2012  8:22 AM  PATIENT:  Alexis [name]  26 y.o. female  PRE-OPERATIVE DIAGNOSIS:  Desires Sterilization  POST-OPERATIVE DIAGNOSIS:  Desires Sterilization  PROCEDURE:  Procedure(s) (LRB): POST PARTUM TUBAL LIGATION (Bilateral)  SURGEON:  Surgeon(s) and Role:    * Allie Bossier, MD - Primary  PHYSICIAN ASSISTANT:   ASSISTANTS: none   ANESTHESIA:   epidural  EBL:     BLOOD ADMINISTERED:none  DRAINS: none   LOCAL MEDICATIONS USED:  MARCAINE     SPECIMEN:  No Specimen  DISPOSITION OF SPECIMEN:  N/A  COUNTS:  YES  TOURNIQUET:  * No tourniquets in log *  DICTATION: .Dragon Dictation  PLAN OF CARE: Discharge home later today per patient request  PATIENT DISPOSITION:  PACU - hemodynamically stable.   Delay start of Pharmacological VTE agent (>24hrs) due to surgical blood loss or risk of bleeding: not applicable  The risks, benefits, and alternatives of surgery were explained, understood, and accepted. I discussed the failure rate of a tubal ligation of up to one in 200 people. She accepts all risks of surgery and wishes to proceed. Consents were signed, and all questions were answered. In the operating room her epidural was bolused for surgery after adequate anesthesia was assured her abdomen was prepped with ChloraPrep. 10 mL of 0.5% Marcaine were injected into the subcutaneous tissue at the umbilicus. A transverse him political incision was made the fascia was elevated with Coker clamps and fascial incision was made with Mayo scissors. The peritoneum was entered with hemostats. I used retractors to visualize the oviduct on the right. It was grasped with a Babcock clamp and traced to its fimbriated end. It was then retraced to the isthmic region where a Filsche clip was placed across the entire tube. No bleeding was noted. The same procedure was done on the other side. Both oviducts appeared normal. The fascia was then elevated with Coker clamps and closed  with a 0 Vicryl running nonlocking suture. No defects were palpable. The subcuticular closure was done with 4-0 Vicryl suture excellent cosmetic results were obtained. She tolerated the procedure well. She was taken to recovery room in stable condition.

## 2012-03-10 NOTE — Op Note (Signed)
Please see Brief Op Note

## 2012-08-28 ENCOUNTER — Emergency Department (HOSPITAL_COMMUNITY)
Admission: EM | Admit: 2012-08-28 | Discharge: 2012-08-28 | Disposition: A | Discharge status: Other Acute Inpatient Hospital | Payer: 59 | Attending: Emergency Medicine | Admitting: Emergency Medicine

## 2012-08-28 ENCOUNTER — Encounter (HOSPITAL_COMMUNITY): Payer: Self-pay | Admitting: Emergency Medicine

## 2012-08-28 DIAGNOSIS — F429 Obsessive-compulsive disorder, unspecified: Secondary | ICD-10-CM | POA: Insufficient documentation

## 2012-08-28 DIAGNOSIS — F329 Major depressive disorder, single episode, unspecified: Secondary | ICD-10-CM | POA: Insufficient documentation

## 2012-08-28 DIAGNOSIS — Z3202 Encounter for pregnancy test, result negative: Secondary | ICD-10-CM | POA: Insufficient documentation

## 2012-08-28 DIAGNOSIS — R45851 Suicidal ideations: Secondary | ICD-10-CM | POA: Insufficient documentation

## 2012-08-28 DIAGNOSIS — F17200 Nicotine dependence, unspecified, uncomplicated: Secondary | ICD-10-CM | POA: Insufficient documentation

## 2012-08-28 DIAGNOSIS — F1910 Other psychoactive substance abuse, uncomplicated: Secondary | ICD-10-CM | POA: Insufficient documentation

## 2012-08-28 DIAGNOSIS — F3289 Other specified depressive episodes: Secondary | ICD-10-CM | POA: Insufficient documentation

## 2012-08-28 HISTORY — DX: Depression, unspecified: F32.A

## 2012-08-28 HISTORY — DX: Obsessive-compulsive disorder, unspecified: F42.9

## 2012-08-28 HISTORY — DX: Major depressive disorder, single episode, unspecified: F32.9

## 2012-08-28 LAB — COMPREHENSIVE METABOLIC PANEL
ALT: 9 U/L (ref 0–35)
AST: 13 U/L (ref 0–37)
Albumin: 4.2 g/dL (ref 3.5–5.2)
Calcium: 9.1 mg/dL (ref 8.4–10.5)
Chloride: 104 mEq/L (ref 96–112)
Creatinine, Ser: 0.68 mg/dL (ref 0.50–1.10)
Sodium: 139 mEq/L (ref 135–145)

## 2012-08-28 LAB — RAPID URINE DRUG SCREEN, HOSP PERFORMED
Amphetamines: NOT DETECTED
Benzodiazepines: NOT DETECTED
Opiates: NOT DETECTED
Tetrahydrocannabinol: NOT DETECTED

## 2012-08-28 LAB — SALICYLATE LEVEL: Salicylate Lvl: <2 mg/dL — ABNORMAL LOW (ref 2.8–20.0)

## 2012-08-28 LAB — CBC
MCH: 27.9 pg (ref 26.0–34.0)
MCV: 84.6 fL (ref 78.0–100.0)
Platelets: 217 10*3/uL (ref 150–400)
RBC: 4.41 MIL/uL (ref 3.87–5.11)
RDW: 14.6 % (ref 11.5–15.5)
WBC: 7 10*3/uL (ref 4.0–10.5)

## 2012-08-28 MED ORDER — ACETAMINOPHEN 325 MG PO TABS: 650.0000 mg | ORAL_TABLET | ORAL | Status: DC | PRN

## 2012-08-28 MED ORDER — ONDANSETRON HCL 4 MG PO TABS: 4.0000 mg | ORAL_TABLET | Freq: Three times a day (TID) | ORAL | Status: DC | PRN

## 2012-08-28 MED ORDER — LORAZEPAM 1 MG PO TABS
1.0000 mg | ORAL_TABLET | Freq: Three times a day (TID) | ORAL | Status: DC | PRN
Start: 2012-08-28 — End: 2012-08-29

## 2012-08-28 NOTE — B[facility] Counselor (Signed)
Writer called and faxed in telepsych consult. Dr. Jacky Kindle is on call tonight and should be contacting pt within 1.5 hrs.  Evette Cristal, [place] Assessment Counselor

## 2012-08-28 NOTE — ED Provider Notes (Signed)
History     CSN: [id number]  Arrival date & time 08/28/12  1649   First MD Initiated Contact with Patient 08/28/12 1704      Chief Complaint  Patient presents with  . Medical Clearance    (Consider location/radiation/quality/duration/timing/severity/associated sxs/prior treatment) HPI Comments: Pt states that she is suicidal and she has recently taken 10 doses of klonopin about 3 weeks ago:pt states that she was diagnosed with depression and ocd in the last 6 months , but she thinks that she has bipolar:pt states that she has had a lot of stress at home including her husband leaving her yesterday and she has 3 kids:pt states that her brother committed suicide:pt states that she is thinking that she would overdose with pills again  The history is provided by the patient. No language interpreter was used.    Past Medical History  Diagnosis Date  . No pertinent past medical history   . Depression   . OCD (obsessive compulsive disorder)     Past Surgical History  Procedure Laterality Date  . No past surgeries    . Tubal ligation  02/18/2012    Procedure: POST PARTUM TUBAL LIGATION;  Surgeon: Allie Bossier, MD;  Location: [facility];  Service: Gynecology;  Laterality: Bilateral;    No family history on file.  History  Substance Use Topics  . Smoking status: Current Every Day Smoker -- 0.30 packs/day  . Smokeless tobacco: Never Used  . Alcohol Use: No    OB History   Grav Para Term Preterm Abortions TAB SAB Ect Mult Living   3 3 3  0 0  0   3      Review of Systems  Constitutional: Negative.   Respiratory: Negative.   Cardiovascular: Negative.     Allergies  Review of patient's allergies indicates no known allergies.  Home Medications   Current Outpatient Rx  Name  Route  Sig  Dispense  Refill  . citalopram (CELEXA) 10 MG tablet   Oral   Take 10 mg by mouth daily.         . clonazePAM (KLONOPIN) 0.5 MG tablet   Oral   Take 0.5 mg by mouth 3 (three) times daily  as needed for anxiety.           BP 114/75  Pulse 61  Temp(Src) 98.3 F (36.8 C) (Oral)  Resp 16  SpO2 100%  LMP 08/22/2012  Physical Exam  Nursing note and vitals reviewed. Constitutional: She is oriented to person, place, and time. She appears well-developed and well-nourished.  HENT:  Head: Normocephalic and atraumatic.  Eyes: Conjunctivae and EOM are normal.  Neck: Neck supple.  Cardiovascular: Normal rate and regular rhythm.   Pulmonary/Chest: Effort normal and breath sounds normal.  Abdominal: Soft. Bowel sounds are normal.  Musculoskeletal: Normal range of motion.  Neurological: She is alert and oriented to person, place, and time.  Skin: Skin is warm and dry.  Psychiatric: Her mood appears anxious.    ED Course  Procedures (including critical care time)  Labs Reviewed  SALICYLATE LEVEL - Abnormal; Notable for the following:    Salicylate Lvl <2.0 (*)    All other components within normal limits  ACETAMINOPHEN LEVEL  CBC  COMPREHENSIVE METABOLIC PANEL  ETHANOL  URINE RAPID DRUG SCREEN (HOSP PERFORMED)  POCT PREGNANCY, URINE  POCT PREGNANCY, URINE   No results found.       MDM  Pt having tele psych done  Teressa Lower, NP 08/28/12 2015

## 2012-08-28 NOTE — ED Notes (Addendum)
Pt wanded. Mother took all belongings aside from purse. Butter knife in purse. Purse placed in locker 43. Report called to psych ED.

## 2012-08-28 NOTE — ED Notes (Signed)
Pt has been feeling overwhelmed and has had the feeling of being overwhelmed. States she took too many pills the other day but got over that and was feeling better but feels like she is bipolar like her brother was who committed suicide. States her husband left her yesterday and she has had feelings of suicide.

## 2012-08-28 NOTE — BHH Counselor (Signed)
Pt accepted to [facility], 508-2 to Daleen Bo MD from Arfeen MD. Pt, RN and EDP notified. Support paperwork signed and faxed to [facility]. Originals placed in pt's chart.  Evette Cristal, [place] Assessment Counselor

## 2012-08-28 NOTE — ED Notes (Signed)
Pt currently asleep; no s/s of distress noted. 

## 2012-08-28 NOTE — ED Notes (Signed)
Pt transported to [facility] at [facility] via security. No s/s of distress noted at this time.

## 2012-08-28 NOTE — ED Provider Notes (Addendum)
Medical screening examination/treatment/procedure(s) were conducted as a shared visit with non-physician practitioner(s) and myself.  I personally evaluated the patient during the encounter  Pt is sleeping at this time.  Here for issues with SI and depression. Will continue with psych eval.  Medically stable.  Celene Kras, MD 08/28/12 2113  Pt assessed by the psychiatrist.    Admission to a psychiatric unit is recommended.  Celene Kras, MD 08/28/12 2201

## 2012-08-28 NOTE — BHH Counselor (Signed)
Alexis [name], assessment counselor at [facility], submitted Pt for admission to [facility]. Binnie Rail, [facility] confirmed bed availability and reviewed medical information. Dr. Kathryne Sharper reviewed clinical information and accepted Pt to the service of Dr. Henrietta Dine, room [phone]. Notified Alexis [name] of acceptance.  Harlin Rain Patsy [place], LPC, [facility] Assessment Counselor

## 2012-08-28 NOTE — BH Assessment (Signed)
Assessment Note   Alexis [name] is an 27 y.o. female who presents voluntarily at [facility] with SI and is unable to contract for safety. Pt states her husband told her he wanted to divorce today and then moved out with their 2 yo child. Pt also has 40 month old and 5 yo at home. She says she tried to kill herself 2 weeks ago by taking 10 to 14 Klonopin tabs after fight w/ her husband. Pt reports her brother committed suicide in 2008 and has been dx as bipolar. Pt endorses depressed mood including tearfulness, isolating, fatigue, loss of interest, worthlessness and anger. Pt also reports labile mood that changes on a day to day basis. She sts she has been dx with OCD and she cleans her house for hrs at a time. Pt denies HI and denies [facility]. No delusions noted and no hx of substance abuse. Pt sees Thedore Mins MD for med management. Pt says she is afraid she might go home and try to overdose again if she left hospital, however she denies intent at this time. Pt also feels her meds at working. Pt works as Buyer, retail at [employer]. She says, "I feel almost normal when I'm at work" and "I feel scatterbrained at home". Pt cooperative and pleasant. [facility] telepsych recommends inpatient treatment.   Axis I: Major Depressive Disorder, Recurrent, Severe without Psychotic Features            OCD Axis II: Deferred Axis III:  Past Medical History  Diagnosis Date  . No pertinent past medical history   . Depression   . OCD (obsessive compulsive disorder)    Axis IV: other psychosocial or environmental problems, problems related to social environment and problems with primary support group Axis V: 31-40 impairment in reality testing  Past Medical History:  Past Medical History  Diagnosis Date  . No pertinent past medical history   . Depression   . OCD (obsessive compulsive disorder)     Past Surgical History  Procedure Laterality Date  . No past surgeries    . Tubal ligation   02/18/2012    Procedure: POST PARTUM TUBAL LIGATION;  Surgeon: Allie Bossier, MD;  Location: [facility];  Service: Gynecology;  Laterality: Bilateral;    Family History: No family history on file.  Social History:  reports that she has been smoking.  She has never used smokeless tobacco. She reports that she does not drink alcohol or use illicit drugs.  Additional Social History:  Alcohol / Drug Use Pain Medications: none - pt has no hx of abuse Prescriptions: pt compliant w/ meds Over the Counter: none History of alcohol / drug use?: No history of alcohol / drug abuse Longest period of sobriety (when/how long): none  CIWA: CIWA-Ar BP: 114/75 mmHg Pulse Rate: 61 COWS:    Allergies: No Known Allergies  Home Medications:  (Not in a hospital admission)  OB/GYN Status:  Patient's last menstrual period was 08/22/2012.  General Assessment Data Location of Assessment: [facility] ED Living Arrangements: Children;Spouse/significant other (spouse took 19 yo and left today and moved into another house) Can pt return to current living arrangement?: Yes Admission Status: Voluntary Is patient capable of signing voluntary admission?: Yes Transfer from: Acute Hospital Referral Source: Self/Family/Friend  Education Status Is patient currently in school?: No Current Grade: na Highest grade of school patient has completed: 30 Name of school: [employer]  Risk to self Suicidal Ideation: Yes-Currently Present Suicidal Intent: No Is patient at risk  for suicide?: Yes Suicidal Plan?:  (pt denies intent but says she is worried she may try to OD) Access to Means: Yes Specify Access to Suicidal Means: remainder of her klonopin and celexa rxs What has been your use of drugs/alcohol within the last 12 months?: none Previous Attempts/Gestures: Yes How many times?: 1 (took 10-14 Klonopin tabs 2 wks ago in suicide attempt) Other Self Harm Risks: none Triggers for Past Attempts: Other (Comment) (conflict w/  husband) Intentional Self Injurious Behavior: None Family Suicide History: Yes (brother committed suicide 2008) Recent stressful life event(s): Conflict (Comment);Loss (Comment) (husband left her today, 3 young children) Persecutory voices/beliefs?: No Depression: Yes Depression Symptoms: Tearfulness;Isolating;Fatigue;Loss of interest in usual pleasures;Feeling worthless/self pity;Feeling angry/irritable Substance abuse history and/or treatment for substance abuse?: No Suicide prevention information given to non-admitted patients: Not applicable  Risk to Others Homicidal Ideation: No Thoughts of Harm to Others: No Current Homicidal Intent: No Current Homicidal Plan: No Access to Homicidal Means: No Identified Victim: none History of harm to others?: No Assessment of Violence: None Noted Violent Behavior Description: pt denies hx of violence - calm and pleasant during assessmt Does patient have access to weapons?: No (pt's husband removed gun that had been in their home) Criminal Charges Pending?: No Does patient have a court date: No  Psychosis Hallucinations: None noted Delusions: None noted  Mental Status Report Appear/Hygiene: Other (Comment) (unremarkable) Eye Contact: Good Motor Activity: Freedom of movement Speech: Logical/coherent;Soft Level of Consciousness: Quiet/awake Mood: Depressed;Anxious;Anhedonia;Sad Affect: Appropriate to circumstance;Depressed;Anxious Anxiety Level: Panic Attacks Panic attack frequency: frequent Most recent panic attack: 08/28/12 Thought Processes: Relevant;Coherent Judgement: Impaired Orientation: Person;Place;Time;Situation Obsessive Compulsive Thoughts/Behaviors: Moderate (clean house for several hrs a day)  Cognitive Functioning Concentration: Normal Memory: Recent Intact;Remote Intact IQ: Average Insight: Good Impulse Control: Poor Appetite: Fair Weight Loss: 0 Weight Gain: 0 Sleep: No Change Total Hours of Sleep:  7 Vegetative Symptoms: None  ADLScreening [facility] Assessment Services) Patient's cognitive ability adequate to safely complete daily activities?: Yes Patient able to express need for assistance with ADLs?: Yes Independently performs ADLs?: Yes (appropriate for developmental age)  Abuse/Neglect [facility]) Physical Abuse: Denies Verbal Abuse: Denies Sexual Abuse: Denies  Prior Inpatient Therapy Prior Inpatient Therapy: No Prior Therapy Dates: na Prior Therapy Facilty/Merranda Bolls(s): na Reason for Treatment: nma  Prior Outpatient Therapy Prior Outpatient Therapy: Yes Prior Therapy Dates: currently Prior Therapy Facilty/Tyland Klemens(s): Akintayo MD Reason for Treatment: med management  ADL Screening (condition at time of admission) Patient's cognitive ability adequate to safely complete daily activities?: Yes Patient able to express need for assistance with ADLs?: Yes Independently performs ADLs?: Yes (appropriate for developmental age) Weakness of Legs: None Weakness of Arms/Hands: None  Home Assistive Devices/Equipment Home Assistive Devices/Equipment: Eyeglasses    Abuse/Neglect Assessment (Assessment to be complete while patient is alone) Physical Abuse: Denies Verbal Abuse: Denies Sexual Abuse: Denies Exploitation of patient/patient's resources: Denies Self-Neglect: Denies Values / Beliefs Cultural Requests During Hospitalization: None Spiritual Requests During Hospitalization: None   Advance Directives (For Healthcare) Advance Directive: Patient does not have advance directive;Patient would not like information    Additional Information 1:1 In Past 12 Months?: No CIRT Risk: No Elopement Risk: No Does patient have medical clearance?: Yes     Disposition:  Disposition Initial Assessment Completed: Yes Disposition of Patient: Inpatient treatment program (telepsych recommends inpatient/will work on placement) Type of inpatient treatment program: Adult  On Site  Evaluation by:   Reviewed with Physician:     Donnamarie Rossetti P 08/28/2012 10:10 PM

## 2012-08-28 NOTE — ED Notes (Signed)
MD at bedside. Pt advised of precautions. Changed into scrubs. Had blood drawn. Urine collected and sent to lab.

## 2012-08-29 ENCOUNTER — Encounter (HOSPITAL_COMMUNITY): Payer: Self-pay | Admitting: *Deleted

## 2012-08-29 ENCOUNTER — Inpatient Hospital Stay (HOSPITAL_COMMUNITY)
Admission: AD | Admit: 2012-08-29 | Discharge: 2012-09-01 | DRG: 885 | Disposition: A | Discharge status: Home / Self Care | Payer: 59 | Source: Intra-hospital | Attending: Psychiatry | Admitting: Psychiatry

## 2012-08-29 DIAGNOSIS — F331 Major depressive disorder, recurrent, moderate: Secondary | ICD-10-CM | POA: Diagnosis present

## 2012-08-29 DIAGNOSIS — Z79899 Other long term (current) drug therapy: Secondary | ICD-10-CM

## 2012-08-29 DIAGNOSIS — R45851 Suicidal ideations: Secondary | ICD-10-CM

## 2012-08-29 DIAGNOSIS — F411 Generalized anxiety disorder: Secondary | ICD-10-CM | POA: Diagnosis present

## 2012-08-29 DIAGNOSIS — F332 Major depressive disorder, recurrent severe without psychotic features: Principal | ICD-10-CM | POA: Diagnosis present

## 2012-08-29 DIAGNOSIS — F429 Obsessive-compulsive disorder, unspecified: Secondary | ICD-10-CM | POA: Diagnosis present

## 2012-08-29 HISTORY — DX: Major depressive disorder, recurrent, moderate: F33.1

## 2012-08-29 HISTORY — DX: Generalized anxiety disorder: F41.1

## 2012-08-29 MED ORDER — CLONAZEPAM 0.5 MG PO TABS
0.5000 mg | ORAL_TABLET | Freq: Three times a day (TID) | ORAL | Status: DC | PRN
  Administered 2012-08-29 – 2012-08-30 (×2): 0.5 mg via ORAL
  Filled 2012-08-29 (×2): qty 1

## 2012-08-29 MED ORDER — ALUM & MAG HYDROXIDE-SIMETH 200-200-20 MG/5ML PO SUSP: 30.0000 mL | ORAL | Status: DC | PRN

## 2012-08-29 MED ORDER — ACETAMINOPHEN 325 MG PO TABS: 650.0000 mg | ORAL_TABLET | Freq: Four times a day (QID) | ORAL | Status: DC | PRN

## 2012-08-29 MED ORDER — HYDROXYZINE HCL 25 MG PO TABS
25.0000 mg | ORAL_TABLET | Freq: Every evening | ORAL | Status: DC | PRN
  Administered 2012-08-29: 25 mg via ORAL

## 2012-08-29 MED ORDER — TRAZODONE HCL 50 MG PO TABS
50.0000 mg | ORAL_TABLET | Freq: Every evening | ORAL | Status: DC | PRN
  Administered 2012-08-29 – 2012-08-31 (×3): 50 mg via ORAL
  Filled 2012-08-29 (×3): qty 1

## 2012-08-29 MED ORDER — CITALOPRAM HYDROBROMIDE 20 MG PO TABS
20.0000 mg | ORAL_TABLET | Freq: Every day | ORAL | Status: DC
  Administered 2012-08-30 – 2012-09-01 (×3): 20 mg via ORAL
  Filled 2012-08-29 (×4): qty 1

## 2012-08-29 MED ORDER — CITALOPRAM HYDROBROMIDE 10 MG PO TABS
10.0000 mg | ORAL_TABLET | Freq: Every day | ORAL | Status: DC
  Administered 2012-08-29: 10 mg via ORAL
  Filled 2012-08-29 (×2): qty 1

## 2012-08-29 MED ORDER — MAGNESIUM HYDROXIDE 400 MG/5ML PO SUSP: 30.0000 mL | Freq: Every day | ORAL | Status: DC | PRN

## 2012-08-29 MED ORDER — ENSURE COMPLETE PO LIQD
237.0000 mL | Freq: Two times a day (BID) | ORAL | Status: DC
  Administered 2012-08-29: 237 mL via ORAL

## 2012-08-29 NOTE — Progress Notes (Signed)
Nutrition Brief Note  Patient identified on the Malnutrition Screening Tool (MST) Report  Body mass index is 18.57 kg/(m^2). Patient meets criteria for normal weight based on current BMI.   Wt: 115 lbs Ht: 5'6''  Current diet order is regular. Labs and medications reviewed.   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 Per nurse, pt is new admit and there is no meal intake information for her. Pt says that she has not been eating well, but has been eating some. Pt reports that when she was weighed in hospital, that she had lost 5 lbs. Pt was informed that if she lost any more weight she would be considered underweight. She said that she would be willing to try ensure complete to supplement her meals. She was not interested in information about a healthy diet because she says that she cooks healthy for her three children.  Nutrition Dx: Inadequate oral intake related to depression as evidenced by recent weight loss and reported poor po intake.  Intervention:  1. Ensure Complete po BID, each supplement provides 350 kcal and 13 grams of protein.  Monitor: po intake, weight  Ebbie Latus RD, LDN

## 2012-08-29 NOTE — Progress Notes (Signed)
Adult Psychoeducational Group Note  Date:  08/29/2012 Time:  1:33 PM  Group Topic/Focus:  Self Care:   The focus of this group is to help patients understand the importance of self-care in order to improve or restore emotional, physical, spiritual, interpersonal, and financial health.  Participation Level:  Minimal  Participation Quality:  Appropriate  Affect:  Flat  Cognitive:  Appropriate  Insight: Improving  Engagement in Group:  Improving  Modes of Intervention:  Discussion  Additional Comments:  Pt appeared appropriate but flat during group. Pt was limited in her interactions and stated that she would ask for help when needed to help with her relationship self-care. She also stated that she call, check on, or see her relatives often.  Sharyn Lull 08/29/2012, 1:33 PM
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
## 2012-08-29 NOTE — Progress Notes (Signed)
Adult Psychoeducational Group Note  Date:  08/29/2012 Time:  2015 Group Topic/Focus:  Wrap-Up Group:   The focus of this group is to help patients review their daily goal of treatment and discuss progress on daily workbooks.  Participation Level:  None  Participation Quality:  Appropriate and Resistant  Affect:  Appropriate and Flat  Cognitive:  Lacking  Insight: None  Engagement in Group:  Lacking  Modes of Intervention:  Discussion and Support  Additional Comments:  Pt. Was present in group but didn't participate in group discussion  Gwenevere Ghazi Patience 08/29/2012, 10:28 PM

## 2012-08-29 NOTE — Progress Notes (Signed)
[facility] LCSW Aftercare Discharge Planning Group Note  08/29/2012 10:48 AM  Participation Quality:  Appropriate  Affect:  Appropriate, Blunted, Depressed and Flat  Cognitive:  Appropriate  Insight:  Developing/Improving  Engagement in Group:  Developing/Improving  Modes of Intervention:  Exploration, Problem-solving, Rapport Building and Support  Summary of Progress/Problems:  Patient reports admitting due to SI.  She shared she attempted suicide two weeks ago.  Patient reports having marital problems and he left the home over the weekend.  Patient reports having an outpatient MD but does not have a therapist.  Daily workbook provided.   Wynn Banker 08/29/2012, 10:48 AM

## 2012-08-29 NOTE — Progress Notes (Signed)
This is a first psych admit for [name] who seeks treatment for depression and SI to OD. Pt attempted OD on 14 klonopin 0.5mg  a few weeks ago at which time she did not seek treatment. [name] has expressed desire to end marriage and this is the precipitating factor. 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 Pt sees Dr. Jannifer Franklin OP for med management. Recently switched from zoloft to Celexa and does not feel it has been helpful. No medical problems, no allergies. Pt oriented to unit, level III obs initiated. Fluids, food offered but pt declined. Fall precautions reviewed. Pt flat, sad, depressed but is calm and cooperative. Denies SI/HI/AVH at this time and verbally contracts for safety. Currently resting in bed. Lawrence Marseilles

## 2012-08-29 NOTE — Progress Notes (Signed)
D: Patient in the dayroom on approach.  Patient states she is doing all right.  Patient states she has a lot on her mind.  Patient does not elaborate and is guarded.  Patient states she is having passive SI, denies HI and denies AVH.  Patient verbally contracts for safety.  Patient states, "There are support groups out there for people like me."   A: Staff to monitor Q 15 mins for safety.  Encouragement and support offered.  Scheduled medications administered per orders.  Trazodone administered prn for sleep. R: Patient remains safe on the unit.  Patient attended group tonight.  Patient taking administered medications.  Patient visible on the unit but isolated to her room after group.

## 2012-08-29 NOTE — Progress Notes (Signed)
D- [name] was open and articulate with her symptoms resulting in hospitilization.  Rates depression at 9 and hopelessness at 2.  Confirmed SI and contracted for safety.  Interacting with milieu and attending groups.  Reports poor sleep and appetite with low energy but good ability to pay attention.  A- Support and encouragement given, Referred to free meditation class per request.  Medication education done. Continue current POC and evaluation of treatment goals.  Continue 15' checks for safety.  R- Remains safe.
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
## 2012-08-29 NOTE — H&P (Signed)
Behavioral Health Medical Screening Exam  Alexis [name] is an 27 y.o. female.  Review of Systems  Constitutional: Negative.   HENT: Negative.   Eyes: Negative.   Respiratory: Negative.   Cardiovascular: Negative.   Gastrointestinal: Negative.   Genitourinary: Negative.   Musculoskeletal: Negative.   Skin: Negative.   Neurological: Negative.   Endo/Heme/Allergies: Negative.   Psychiatric/Behavioral: Positive for depression and suicidal ideas. The patient is nervous/anxious.     Physical Exam  Constitutional: She is oriented to person, place, and time. She appears well-developed and well-nourished.  HENT:  Head: Normocephalic and atraumatic.  Right Ear: External ear normal.  Left Ear: External ear normal.  Nose: Nose normal.  Mouth/Throat: Oropharynx is clear and moist.  Eyes: Conjunctivae and EOM are normal. Pupils are equal, round, and reactive to light.  Neck: Normal range of motion. Neck supple.  Cardiovascular: Normal rate, regular rhythm, normal heart sounds and intact distal pulses.   Respiratory: Effort normal and breath sounds normal.  GI: Soft. Bowel sounds are normal.  Genitourinary:  Deferred, no issues voiced  Musculoskeletal: Normal range of motion.  Neurological: She is alert and oriented to person, place, and time. She has normal reflexes.  Skin: Skin is warm and dry.    Blood pressure 101/70, pulse 67, temperature 97.5 F (36.4 C), temperature source Oral, resp. rate 18, height 5\' 6"  (1.676 m), weight 52.164 kg (115 lb), last menstrual period 08/22/2012.  Recommendations:  Based on my evaluation the patient appears to have an emergency medical condition for which I recommend the patient be transferred to the emergency department for further evaluation.  Nanine Means, PMH-NP 08/29/2012, 2:15 PM

## 2012-08-29 NOTE — Tx Team (Signed)
Initial Interdisciplinary Treatment Plan  PATIENT STRENGTHS: (choose at least two) Ability for insight Average or above average intelligence Capable of independent living Communication skills Motivation for treatment/growth Physical Health Supportive family/friends Work skills  PATIENT STRESSORS: Marital or family conflict   PROBLEM LIST: Problem List/Patient Goals Date to be addressed Date deferred Reason deferred Estimated date of resolution  Depression 08/29/12     SI 08/29/12                                                DISCHARGE CRITERIA:  Improved stabilization in mood, thinking, and/or behavior Need for constant or close observation no longer present Reduction of life-threatening or endangering symptoms to within safe limits Verbal commitment to aftercare and medication compliance  PRELIMINARY DISCHARGE PLAN: Outpatient therapy Return to previous living arrangement Return to previous work or school arrangements  PATIENT/FAMIILY INVOLVEMENT: This treatment plan has been presented to and reviewed with the patient, Alexis [name].  The patient and family have been given the opportunity to ask questions and make suggestions.  Merian Capron [facility] 08/29/2012, 3:58 AM
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
## 2012-08-29 NOTE — [facility] Suicide Risk Assessment (Signed)
Suicide Risk Assessment  Admission Assessment     Nursing information obtained from:  Patient Demographic factors:  Adolescent or young adult;Caucasian Current Mental Status:  Suicidal ideation indicated by patient;Suicide plan;Self-harm thoughts;Plan includes specific time, place, or method;Intention to act on suicide plan;Belief that plan would result in death Loss Factors:  Loss of significant relationship Historical Factors:  Prior suicide attempts;Family history of suicide;Family history of mental illness or substance abuse Risk Reduction Factors:  Responsible for children under 84 years of age;Sense of responsibility to family;Employed;Positive social support;Positive therapeutic relationship  CLINICAL FACTORS:   Depression:   Anhedonia Hopelessness Impulsivity Insomnia Severe  COGNITIVE FEATURES THAT CONTRIBUTE TO RISK:  Cognitively intact   SUICIDE RISK:   Moderate:  Frequent suicidal ideation with limited intensity, and duration, some specificity in terms of plans, no associated intent, good self-control, limited dysphoria/symptomatology, some risk factors present, and identifiable protective factors, including available and accessible social support.  PLAN OF CARE: Education, adjust medications as needed, encourage patient to attend groups.  I certify that inpatient services furnished can reasonably be expected to improve the patient's condition.  RAVI, HIMABINDU 08/29/2012, 11:37 AM

## 2012-08-30 DIAGNOSIS — F411 Generalized anxiety disorder: Secondary | ICD-10-CM

## 2012-08-30 MED ORDER — CLONAZEPAM 0.5 MG PO TABS
0.5000 mg | ORAL_TABLET | Freq: Two times a day (BID) | ORAL | Status: DC | PRN
  Administered 2012-08-30 – 2012-08-31 (×2): 0.5 mg via ORAL
  Filled 2012-08-30 (×2): qty 1

## 2012-08-30 MED ORDER — RISPERIDONE 0.25 MG PO TABS
0.2500 mg | ORAL_TABLET | Freq: Two times a day (BID) | ORAL | Status: DC
  Administered 2012-08-30 – 2012-09-01 (×4): 0.25 mg via ORAL
  Filled 2012-08-30 (×6): qty 1

## 2012-08-30 NOTE — Progress Notes (Signed)
Grief and Loss Group  Patients discussed significant losses and grief in there lives.  Patients explored ways to cope effectively with loss and associated grief emotions.    Pt was present in group and attentive to group members as they shared.  Akers, Whitney Counseling Intern [employer] 

## 2012-08-30 NOTE — Progress Notes (Signed)
[facility] LCSW Aftercare Discharge Planning Group Note  08/30/2012 3:33 PM  Participation Quality:  Appropriate and Attentive  Affect:  Appropriate, Depressed and Flat  Cognitive:  Appropriate  Insight:  Engaged  Engagement in Group:  Engaged  Modes of Intervention:  Exploration, Problem-solving, Rapport Building and Support  Summary of Progress/Problems:  Patient advised of having off/on SI but able to contract for safety.  She rated depression and anxiety at seven.  She advised of being scheduled to outpatient MD tomorrow and asked that appointment be cancelled.  Wynn Banker 08/30/2012, 3:33 PM

## 2012-08-30 NOTE — Progress Notes (Signed)
D:  Patient up and active in the milieu today.  Has been attending groups and interacting with peers, but is quiet.  Rates depression at 7 and hopelessness at 6.  She states that she is having suicidal thoughts, but agrees to seek out staff if feeling unsafe.  Complained of anxiety twice today and requested medication for it.  A:  Medications given as scheduled.  Patient educated about new medication that was started at dinner time.  She was also given clonazepam twice with good effect.    R:  Pleasant and cooperative.  Interacting well with staff and peers.  Tolerating medications well.  Safety on the unit is maintained.  Patient agrees to seek out staff if feeling unsafe.

## 2012-08-30 NOTE — Progress Notes (Signed)
D: Patient lying down int dayroom tearful on approach.  Patient states , "I am not ok."  Patient states she has had a bad day due to increased anxiety and her husband leaving their home and going to move in with his mother.  Patient states this is part of the reason she is at [facility] due to marital problems.  Patient rates depression 8/10 and anxiety 8/10.  Patient states she is having SI on and off.  Patient verbally contracts for safety.  Patient denies HI and denies AVH.    A: Staff to monitor Q 15 mins for safety.  Encouragement and support offered.  Scheduled medications administered per orders.  Trazodone administered prn for sleep. R: Patient remains safe on the unit.  Patient attended group tonight.  Patient visible on the unit but not interacting with peers.  Patient taking administered medications.

## 2012-08-30 NOTE — H&P (Signed)
Reviewed
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
## 2012-08-30 NOTE — Progress Notes (Signed)
Adult Psychoeducational Group Note  Date:  08/30/2012 Time:  2:14 PM  Group Topic/Focus:  Recovery Goals:   The focus of this group is to identify appropriate goals for recovery and establish a plan to achieve them.  Participation Level:  Active  Participation Quality:  Appropriate and Attentive  Affect:  Appropriate  Cognitive:  Appropriate  Insight: Appropriate  Engagement in Group:  Engaged  Modes of Intervention:  Discussion  Additional Comments:  Pt was attentive and appropriate during group discussion. Pt stated that Recovery to her is getting better for yourself and that being with recognizing that you need support and help. Pt shared that she has being denied by family which lead to anger issues. Pt. Stated that her first goal/challenge was coming to Hospital. Pt. Stated that she is willing to join support groups and continue to take medication to assist with her mood.   Sharyn Lull 08/30/2012, 2:14 PM

## 2012-08-30 NOTE — Progress Notes (Signed)
[facility] MD Progress Note  08/30/2012 12:47 PM Alexis [name] Xxx-Dalto  MRN:  [id number] Subjective:  Patient reports sleeping better. Continues to have severe anxiety, states she has racing thoughts thinking about her children, husband. Continues to endorse depressed mood.  Diagnosis:   Axis I: Anxiety Disorder NOS, Major Depression, Recurrent severe and Obsessive Compulsive Disorder Axis II: Deferred Axis III:  Past Medical History  Diagnosis Date  . No pertinent past medical history   . Depression   . OCD (obsessive compulsive disorder)    Axis IV: occupational problems and other psychosocial or environmental problems Axis V: 41-50 serious symptoms  ADL's:  Intact  Sleep: Fair  Appetite:  Poor    Psychiatric Specialty Exam: Review of Systems  Constitutional: Negative.   HENT: Negative.   Eyes: Negative.   Respiratory: Negative.   Cardiovascular: Negative.   Gastrointestinal: Negative.   Genitourinary: Negative.   Musculoskeletal: Negative.   Skin: Negative.   Neurological: Negative.   Endo/Heme/Allergies: Negative.   Psychiatric/Behavioral: Positive for depression and suicidal ideas. The patient is nervous/anxious.     Blood pressure 100/66, pulse 88, temperature 97.6 F (36.4 C), temperature source Oral, resp. rate 16, height 5\' 6"  (1.676 m), weight 52.164 kg (115 lb), last menstrual period 08/22/2012.Body mass index is 18.57 kg/(m^2).  General Appearance: Casual  Eye Contact::  Fair  Speech:  Clear and Coherent  Volume:  Normal  Mood:  Anxious, Depressed and Dysphoric  Affect:  Constricted, Depressed and Flat  Thought Process:  Coherent  Orientation:  Full (Time, Place, and Person)  Thought Content:  Rumination  Suicidal Thoughts:  No  Homicidal Thoughts:  No  Memory:  Immediate;   Fair Recent;   Fair Remote;   Fair  Judgement:  Fair  Insight:  Present  Psychomotor Activity:  Normal  Concentration:  Fair  Recall:  Fair  Akathisia:  No  Handed:  Right   AIMS (if indicated):     Assets:  Communication Skills Desire for Improvement Housing Social Support  Sleep:  Number of Hours: 6   Current Medications: Current Facility-Administered Medications  Medication Dose Route Frequency Provider Last Rate Last Dose  . acetaminophen (TYLENOL) tablet 650 mg  650 mg Oral Q6H PRN Cleotis Nipper, MD      . alum & mag hydroxide-simeth (MAALOX/MYLANTA) 200-200-20 MG/5ML suspension 30 mL  30 mL Oral Q4H PRN Cleotis Nipper, MD      . citalopram (CELEXA) tablet 20 mg  20 mg Oral Daily Chrisandra Wiemers, MD   20 mg at 08/30/12 0747  . clonazePAM (KLONOPIN) tablet 0.5 mg  0.5 mg Oral BID PRN Latorsha Curling, MD      . feeding supplement (ENSURE COMPLETE) liquid 237 mL  237 mL Oral BID BM Earna Coder, RD   237 mL at 08/29/12 1941  . magnesium hydroxide (MILK OF MAGNESIA) suspension 30 mL  30 mL Oral Daily PRN Cleotis Nipper, MD      . risperiDONE (RISPERDAL) tablet 0.25 mg  0.25 mg Oral BID Aaleigha Bozza, MD      . traZODone (DESYREL) tablet 50 mg  50 mg Oral QHS PRN Cyndi Montejano, MD   50 mg at 08/29/12 2142    Lab Results:  Results for orders placed during the hospital encounter of 08/28/12 (from the past 48 hour(s))  ACETAMINOPHEN LEVEL     Status: None   Collection Time    08/28/12  5:06 PM      Result Value Range  Acetaminophen (Tylenol), Serum <15.0  10 - 30 ug/mL   Comment:            THERAPEUTIC CONCENTRATIONS VARY     SIGNIFICANTLY. A RANGE OF 10-30     ug/mL MAY BE AN EFFECTIVE     CONCENTRATION FOR MANY PATIENTS.     HOWEVER, SOME ARE BEST TREATED     AT CONCENTRATIONS OUTSIDE THIS     RANGE.     ACETAMINOPHEN CONCENTRATIONS     >150 ug/mL AT 4 HOURS AFTER     INGESTION AND >50 ug/mL AT 12     HOURS AFTER INGESTION ARE     OFTEN ASSOCIATED WITH TOXIC     REACTIONS.  CBC     Status: None   Collection Time    08/28/12  5:06 PM      Result Value Range   WBC 7.0  4.0 - 10.5 K/uL   RBC 4.41  3.87 - 5.11 MIL/uL   Hemoglobin 12.3  12.0  - 15.0 g/dL   HCT 16.1  09.6 - 04.5 %   MCV 84.6  78.0 - 100.0 fL   MCH 27.9  26.0 - 34.0 pg   MCHC 33.0  30.0 - 36.0 g/dL   RDW 40.9  81.1 - 91.4 %   Platelets 217  150 - 400 K/uL  COMPREHENSIVE METABOLIC PANEL     Status: None   Collection Time    08/28/12  5:06 PM      Result Value Range   Sodium 139  135 - 145 mEq/L   Potassium 3.6  3.5 - 5.1 mEq/L   Chloride 104  96 - 112 mEq/L   CO2 26  19 - 32 mEq/L   Glucose, Bld 89  70 - 99 mg/dL   BUN 6  6 - 23 mg/dL   Creatinine, Ser 7.82  0.50 - 1.10 mg/dL   Calcium 9.1  8.4 - 95.6 mg/dL   Total Protein 7.4  6.0 - 8.3 g/dL   Albumin 4.2  3.5 - 5.2 g/dL   AST 13  0 - 37 U/L   ALT 9  0 - 35 U/L   Alkaline Phosphatase 68  39 - 117 U/L   Total Bilirubin 0.3  0.3 - 1.2 mg/dL   GFR calc non Af Amer >90  >90 mL/min   GFR calc Af Amer >90  >90 mL/min   Comment:            The eGFR has been calculated     using the CKD EPI equation.     This calculation has not been     validated in all clinical     situations.     eGFR's persistently     <90 mL/min signify     possible Chronic Kidney Disease.  ETHANOL     Status: None   Collection Time    08/28/12  5:06 PM      Result Value Range   Alcohol, Ethyl (B) <11  0 - 11 mg/dL   Comment:            LOWEST DETECTABLE LIMIT FOR     SERUM ALCOHOL IS 11 mg/dL     FOR MEDICAL PURPOSES ONLY  SALICYLATE LEVEL     Status: Abnormal   Collection Time    08/28/12  5:06 PM      Result Value Range   Salicylate Lvl <2.0 (*) 2.8 - 20.0 mg/dL  URINE RAPID DRUG SCREEN (HOSP PERFORMED)  Status: None   Collection Time    08/28/12  5:10 PM      Result Value Range   Opiates NONE DETECTED  NONE DETECTED   Cocaine NONE DETECTED  NONE DETECTED   Benzodiazepines NONE DETECTED  NONE DETECTED   Amphetamines NONE DETECTED  NONE DETECTED   Tetrahydrocannabinol NONE DETECTED  NONE DETECTED   Barbiturates NONE DETECTED  NONE DETECTED   Comment:            DRUG SCREEN FOR MEDICAL PURPOSES     ONLY.  IF  CONFIRMATION IS NEEDED     FOR ANY PURPOSE, NOTIFY LAB     WITHIN 5 DAYS.                LOWEST DETECTABLE LIMITS     FOR URINE DRUG SCREEN     Drug Class       Cutoff (ng/mL)     Amphetamine      1000     Barbiturate      200     Benzodiazepine   200     Tricyclics       300     Opiates          300     Cocaine          300     THC              50  POCT PREGNANCY, URINE     Status: None   Collection Time    08/28/12  5:21 PM      Result Value Range   Preg Test, Ur NEGATIVE  NEGATIVE   Comment:            THE SENSITIVITY OF THIS     METHODOLOGY IS >24 mIU/mL  POCT PREGNANCY, URINE     Status: None   Collection Time    08/28/12  5:23 PM      Result Value Range   Preg Test, Ur NEGATIVE  NEGATIVE   Comment:            THE SENSITIVITY OF THIS     METHODOLOGY IS >24 mIU/mL    Physical Findings: AIMS: Facial and Oral Movements Muscles of Facial Expression: None, normal Lips and Perioral Area: None, normal Jaw: None, normal Tongue: None, normal,Extremity Movements Upper (arms, wrists, hands, fingers): None, normal Lower (legs, knees, ankles, toes): None, normal, Trunk Movements Neck, shoulders, hips: None, normal, Overall Severity Severity of abnormal movements (highest score from questions above): None, normal Incapacitation due to abnormal movements: None, normal Patient's awareness of abnormal movements (rate only patient's report): No Awareness, Dental Status Current problems with teeth and/or dentures?: No Does patient usually wear dentures?: No  CIWA:    COWS:     Treatment Plan Summary: Daily contact with patient to assess and evaluate symptoms and progress in treatment Medication management  Plan: Discussed with patient that the OCD and depression can cause severe anxiety and agitation. Start risperdal 0.25mg  po bid, the side effects and benefits discussed. Encouraged patient to attend groups and learn coping skills to deal with her anxiety.   Medical  Decision Making Problem Points:  Established problem, stable/improving (1), Review of last therapy session (1) and Review of psycho-social stressors (1) Data Points:  Review of medication regiment & side effects (2)  I certify that inpatient services furnished can reasonably be expected to improve the patient's condition.   Eugene Zeiders 08/30/2012, 12:47 PM

## 2012-08-30 NOTE — Progress Notes (Signed)
[facility] LCSW Group Therapy      Feelings About Diagnosis 1:15 - 2:30 PM          08/30/2012 3:30 PM  Type of Therapy:  Group Therapy  Participation Level:  Active  Participation Quality:  Appropriate  Affect:  Appropriate and Depressed  Cognitive:  Appropriate  Insight:  Engaged  Engagement in Therapy:  Engaged  Modes of Intervention:  Discussion, Education, Exploration, Problem-solving, Rapport Building and Support  Summary of Progress/Problems  :Patient shared it is hard for her to deal with having a diagnosis of depression.  She stated she becomes depressed due to having the diagnosis.  She shared she knows she needs a therapist and has been in denial of needing someone to talk with about problems.  Wynn Banker 08/30/2012, 3:30 PM

## 2012-08-31 MED ORDER — NICOTINE 21 MG/24HR TD PT24
21.0000 mg | MEDICATED_PATCH | Freq: Every day | TRANSDERMAL | Status: DC
  Administered 2012-08-31 – 2012-09-01 (×2): 21 mg via TRANSDERMAL
  Filled 2012-08-31 (×3): qty 1

## 2012-08-31 NOTE — Progress Notes (Signed)
Recreation Therapy Notes  Date: 03.05.2014  Time: 2:55pm  Location: [address] Day Room   Group Topic/Focus: Musician (AAA/T)   Participation Level:  Active   Participation Quality:  Appropriate   Affect:  Euthymic   Cognitive:  Appropriate   Additional Comments: Patient pet and visited with Summer, the dog. Patient fed summer a treat. Patient interacted appropriately with LRT, peers and dog team.   Jearl Klinefelter, LRT/CTRS        Jearl Klinefelter 08/31/2012 4:16 PM

## 2012-08-31 NOTE — Progress Notes (Signed)
Adult Psychoeducational Group Note  Date:  08/31/2012 Time:  7:05 PM  Group Topic/Focus:  Personal Choices and Values:   The focus of this group is to help patients assess and explore the importance of values in their lives, how their values affect their decisions, how they express their values and what opposes their expression.  Participation Level:  Active  Participation Quality:  Appropriate, Attentive and Sharing  Affect:  Appropriate  Cognitive:  Appropriate  Insight: Appropriate and Good  Engagement in Group:  Distracting  Modes of Intervention:  Discussion, Education and Support  Additional Comments:  [name] attended group and participated. Patient shared personal term of values and choices. Patient was asked to expalin the negative and positive choices and values that have been made throughout lifespan, but patient did not comment. Patient then discussed what the outcome of the negative choices effect on patient life. Patient completed form in group on Identifying values and Choosing a value orientating life worksheet, and explained answers within the group.   Karleen Hampshire Brittini 08/31/2012, 7:05 PM
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
## 2012-08-31 NOTE — Progress Notes (Signed)
[facility] LCSW Aftercare Discharge Planning Group Note  08/31/2012 12:31 PM  Participation Quality:  Appropriate and Attentive  Affect:  Appropriate, Depressed and Flat  Cognitive:  Appropriate  Insight:  Engaged  Engagement in Group:  Engaged  Modes of Intervention:  Exploration, Problem-solving, Rapport Building and Support  Summary of Progress/Problems:  Patient continues to endorse SI but able to contract for safety.  She rates depression and anxiety at eight.  Patient shared her goal for today is not to worry about her husband.  Wynn Banker 08/31/2012, 12:31 PM

## 2012-08-31 NOTE — Progress Notes (Signed)
Recreation Therapy Notes  Date: 03.05.2014  Time: 3:00pm  Location: [address] Day Room   Group Topic/Focus: Communication   Participation Level:  Active   Participation Quality:  Appropriate   Affect:  Eutymic   Cognitive:  Appropriate   Additional Comments: Patient chose item out of bag to describe to peers. Patient successfully described object to peers. Patient engaged in discussion on communication and how it can effect depression.   Marykay Lex Blanchfield, LRT/CTRS   Blanchfield, Denise L 08/31/2012 4:30 PM

## 2012-08-31 NOTE — Progress Notes (Signed)
  D) Patient quiet and guarded upon my assessment. Patient completed Patient Self Inventory, reports slept "fair," and  appetite is "improving." Patient rates depression as  7 /10, patient rates hopeless feelings as  5/10. Patient endorses "off and on" SI, contracts verbally for safety with RN.  Patient denies HI, denies A/V hallucinations.   A) Patient offered support and encouragement, patient encouraged to discuss feelings/concerns with staff. Patient verbalized understanding. Patient monitored Q15 minutes for safety. Patient met with MD  to discuss today's goals and plan of care.  R) Patient visible in milieu, attending groups in day room and meals in dining room. Patient appropriate with staff and peers.   Patient taking medications as ordered. Patient insightful with a plan to "get back to my normal state, take my medication like I'm supposed to and see therapist and my psychiatrist." Will continue to monitor.

## 2012-08-31 NOTE — Progress Notes (Addendum)
D: Patient states her day was better.  Patient states ,  "My day was better today."  Patient appeared brighter today.  Patient states her depression and anxiety has lowered.  Patient denies SI/HI and denies AVH.  Patient does not initiate conversation. A: Staff to monitor Q 15 mins for safety.  Encouragement and support offered.  No scheduled medications administered per orders.  Trazodone administered prn for sleep.   R: Patient remains safe on the unit.  Patient attended group tonight.  Patient calm, cooperative and taking administered medications.
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
## 2012-08-31 NOTE — Progress Notes (Signed)
[facility] LCSW Group Therapy      Emotional Regulation 1:15 - 2:30 PM          08/31/2012 3:32 PM  Type of Therapy:  Group Therapy  Participation Level:  Active  Participation Quality:  Appropriate  Affect:  Appropriate and Depressed  Cognitive:  Appropriate  Insight:  Engaged  Engagement in Therapy:  Engaged  Modes of Intervention:  Discussion, Education, Exploration, Problem-solving, Rapport Building and Support  Summary of Progress/Problems:  Patient shared she has to learn to talk.  She stated she tends to hold things in even the fact that family has said she would be like her father and her mentally ill brother.  Patient shared she had been in denial for years regarding her need for help.  Wynn Banker 08/31/2012, 3:32 PM

## 2012-09-01 DIAGNOSIS — F429 Obsessive-compulsive disorder, unspecified: Secondary | ICD-10-CM

## 2012-09-01 DIAGNOSIS — F332 Major depressive disorder, recurrent severe without psychotic features: Principal | ICD-10-CM

## 2012-09-01 MED ORDER — TRAZODONE HCL 50 MG PO TABS: 50.0000 mg | ORAL_TABLET | Freq: Every evening | ORAL | Status: DC | PRN

## 2012-09-01 MED ORDER — CITALOPRAM HYDROBROMIDE 20 MG PO TABS: 20.0000 mg | ORAL_TABLET | Freq: Every day | ORAL | Status: DC

## 2012-09-01 MED ORDER — RISPERIDONE 0.25 MG PO TABS: 0.2500 mg | ORAL_TABLET | Freq: Two times a day (BID) | ORAL | Status: DC

## 2012-09-01 MED ORDER — CLONAZEPAM 0.5 MG PO TABS: 0.5000 mg | ORAL_TABLET | Freq: Two times a day (BID) | ORAL | Status: DC | PRN

## 2012-09-01 NOTE — Progress Notes (Signed)
[facility] Group Notes:  (Nursing/MHT/Case Management/Adjunct)  Date:  09/01/2012  Time:  1:29 AM  Type of Therapy:  Group Therapy  Participation Level:  Active  Participation Quality:  Appropriate  Affect:  Appropriate  Cognitive:  Appropriate  Insight:  Appropriate  Engagement in Group:  Engaged  Modes of Intervention:  Support   Summary of Progress/Problems: Pt. Stated getting better for self and care for her kids were important to her.  Sondra Come 09/01/2012, 1:29 AM

## 2012-09-01 NOTE — Progress Notes (Signed)
[facility] Adult Case Management Discharge Plan :  Will you be returning to the same living situation after discharge: Yes,  Patient is returning to her home At discharge, do you have transportation home?:Yes,  Patient to arrange for family to transport her home. Do you have the ability to pay for your medications:Yes,  Patient can afford medications  Release of information consent forms completed and in the chart;  Patient's signature needed at discharge.  Patient to Follow up at: Follow-up Information   Follow up with Dr. Jannifer Franklin On 09/09/2012. (You are scheduled with Dr. Jannifer Franklin on Friday, September 09, 2012 at 4:40 PM)    Contact information:   [address] [place] [address] [place], [place]   [address]  [phone]      Follow up with [facility] On 09/06/2012. (You are scheduled with Earle Gell on Tuesday, September 06, 2012 at 10AM)    Contact information:   [address] [place], [place]  [address]  [phone]      Patient denies SI/HI:   Yes,  Patient is not endorsing SI/HI or thoughts of self harm.    Safety Planning and Suicide Prevention discussed:  Yes,  Reviewed during aftercare groups.  Alexis [name], Alexis [name] 09/01/2012, 11:01 AM

## 2012-09-01 NOTE — Progress Notes (Signed)
[facility] LCSW Aftercare Discharge Planning Group Note  09/01/2012 11:19 AM  Participation Quality:  Appropriate and Attentive  Affect:  Appropriate  Cognitive:  Alert and Appropriate  Insight:  Engaged  Engagement in Group:  Engaged  Modes of Intervention:  Exploration, Problem-solving, Rapport Building and Support  Summary of Progress/Problems:  Patient reports doing well today.  She denies SI/HI and rates symptoms at one.  She shared she took time to Clinical research associate out her feelings and believes that has help her to get better.  She reports being ready to discharge home today.  Daily workbook provided.   Wynn Banker 09/01/2012, 11:19 AM

## 2012-09-01 NOTE — Progress Notes (Signed)
[facility] Adult Case Management Discharge Plan :  Will you be returning to the same living situation after discharge: Yes,  Patient will return to her home. At discharge, do you have transportation home?:Yes,  Patient to arrange for family to transport her home. Do you have the ability to pay for your medications:Yes,  Patient can afford medications  Release of information consent forms completed and in the chart;  Patient's signature needed at discharge.  Patient to Follow up at: Follow-up Information   Follow up with Dr. Jannifer Franklin On 09/09/2012. (You are scheduled with Dr. Jannifer Franklin on Friday, September 09, 2012 at 4:40 PM)    Contact information:   [address] [place] [address] [place], [place]   [address]  [phone]      Follow up with [facility] On 09/06/2012. (You are scheduled with Earle Gell on Tuesday, September 06, 2012 at 10AM)    Contact information:   [address] [place], [place]  [address]  [phone]      Patient denies SI/HI:   Yes,  Patient is not endorsing SI/HI or thoughts of self harm.    Safety Planning and Suicide Prevention discussed:  Yes,  Reviewed during aftercare group.  Wynn Banker 09/01/2012, 11:22 AM

## 2012-09-01 NOTE — [facility] Suicide Risk Assessment (Signed)
Suicide Risk Assessment  Discharge Assessment     Demographic Factors:  Female, caucasian, employed  Mental Status Per Nursing Assessment::   On Admission:  Suicidal ideation indicated by patient;Suicide plan;Self-harm thoughts;Plan includes specific time, place, or method;Intention to act on suicide plan;Belief that plan would result in death  Current Mental Status by Physician: Patient alert and oriented to 4. Denies AH/VH/SI/HI.  Loss Factors: Loss of significant relationship  Historical Factors: Family history of mental illness or substance abuse and Impulsivity  Risk Reduction Factors:   Responsible for children under 53 years of age, Sense of responsibility to family, Employed, Positive social support and Positive coping skills or problem solving skills  Continued Clinical Symptoms:  Depression:   Recent sense of peace/wellbeing Obsessive-Compulsive Disorder  Cognitive Features That Contribute To Risk:  Cognitively intact  Suicide Risk:  Minimal: No identifiable suicidal ideation.  Patients presenting with no risk factors but with morbid ruminations; may be classified as minimal risk based on the severity of the depressive symptoms  Discharge Diagnoses:   AXIS I:  Major Depression, Recurrent severe and Obsessive Compulsive Disorder AXIS II:  Deferred AXIS III:   Past Medical History  Diagnosis Date  . No pertinent past medical history   . Depression   . OCD (obsessive compulsive disorder)    AXIS IV:  other psychosocial or environmental problems AXIS V:  61-70 mild symptoms  Plan Of Care/Follow-up recommendations:  Activity:  regular Diet:  regular Follow up with outpatient appointments.  Is patient on multiple antipsychotic therapies at discharge:  No   Has Patient had three or more failed trials of antipsychotic monotherapy by history:  No  Recommended Plan for Multiple Antipsychotic Therapies: NA  RAVI, HIMABINDU 09/01/2012, 10:47 AM

## 2012-09-01 NOTE — Progress Notes (Signed)
Patient denies SI/HI, denies A/V hallucinations. Patient verbalizes understanding of discharge instructions, follow up care and prescriptions. Patient given all belongings from [facility] locker. Patient escorted out by staff, transported by family. 

## 2012-09-01 NOTE — Progress Notes (Signed)
Adult Psychoeducational Group Note  Date:  09/01/2012 Time:  1100   Group Topic/Focus:  Rediscovering Joy:   The focus of this group is to explore various ways to relieve stress in a positive manner.  Participation Level:  Active  Participation Quality:  Appropriate, Attentive, Sharing and Supportive  Affect:  Appropriate  Cognitive:  Alert, Appropriate and Oriented  Insight: Appropriate  Engagement in Group:  Engaged and Supportive  Modes of Intervention:  Discussion, Education and Socialization  Additional Comments:  Patient insightful with a plan to increase joy by "not getting so angry and writing things that bother me down in my notebook."   Noah Charon 09/01/2012, 12:39 PM
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
## 2012-09-06 NOTE — Discharge Summary (Signed)
Reviewed

## 2012-09-06 NOTE — Progress Notes (Signed)
Patient Discharge Instructions:  After Visit Summary (AVS):   Faxed to:  09/06/12 Discharge Summary Note:   Faxed to:  09/06/12 Psychiatric Admission Assessment Note:   Faxed to:  09/06/12 Suicide Risk Assessment - Discharge Assessment:   Faxed to:  09/06/12 Faxed/Sent to the Next Level Care provider:  09/06/12 Faxed to Dr. Thedore Mins @ [phone] Faxed to [facility] @ [phone]  Jerelene Redden, 09/06/2012, 2:14 PM

## 2013-03-07 ENCOUNTER — Encounter (HOSPITAL_COMMUNITY): Payer: Self-pay | Admitting: Emergency Medicine

## 2013-03-07 ENCOUNTER — Emergency Department (HOSPITAL_COMMUNITY)
Admission: EM | Admit: 2013-03-07 | Discharge: 2013-03-09 | Disposition: A | Discharge status: Other Acute Inpatient Hospital | Payer: 59 | Attending: Emergency Medicine | Admitting: Emergency Medicine

## 2013-03-07 DIAGNOSIS — Z79899 Other long term (current) drug therapy: Secondary | ICD-10-CM | POA: Insufficient documentation

## 2013-03-07 DIAGNOSIS — Z3202 Encounter for pregnancy test, result negative: Secondary | ICD-10-CM | POA: Insufficient documentation

## 2013-03-07 DIAGNOSIS — F329 Major depressive disorder, single episode, unspecified: Secondary | ICD-10-CM | POA: Insufficient documentation

## 2013-03-07 DIAGNOSIS — T424X4A Poisoning by benzodiazepines, undetermined, initial encounter: Secondary | ICD-10-CM | POA: Insufficient documentation

## 2013-03-07 DIAGNOSIS — T43502A Poisoning by unspecified antipsychotics and neuroleptics, intentional self-harm, initial encounter: Secondary | ICD-10-CM | POA: Insufficient documentation

## 2013-03-07 DIAGNOSIS — F319 Bipolar disorder, unspecified: Secondary | ICD-10-CM | POA: Diagnosis present

## 2013-03-07 DIAGNOSIS — F429 Obsessive-compulsive disorder, unspecified: Secondary | ICD-10-CM | POA: Insufficient documentation

## 2013-03-07 DIAGNOSIS — F17200 Nicotine dependence, unspecified, uncomplicated: Secondary | ICD-10-CM | POA: Insufficient documentation

## 2013-03-07 DIAGNOSIS — T1491XA Suicide attempt, initial encounter: Secondary | ICD-10-CM | POA: Diagnosis present

## 2013-03-07 DIAGNOSIS — F411 Generalized anxiety disorder: Secondary | ICD-10-CM | POA: Insufficient documentation

## 2013-03-07 HISTORY — DX: Anxiety disorder, unspecified: F41.9

## 2013-03-07 LAB — CBC
HCT: 35.9 % — ABNORMAL LOW (ref 36.0–46.0)
MCV: 88.9 fL (ref 78.0–100.0)
Platelets: 168 10*3/uL (ref 150–400)
RBC: 4.04 MIL/uL (ref 3.87–5.11)
WBC: 5.7 10*3/uL (ref 4.0–10.5)

## 2013-03-07 LAB — COMPREHENSIVE METABOLIC PANEL
AST: 12 U/L (ref 0–37)
Albumin: 3.7 g/dL (ref 3.5–5.2)
Alkaline Phosphatase: 53 U/L (ref 39–117)
CO2: 27 mEq/L (ref 19–32)
Chloride: 105 mEq/L (ref 96–112)
Creatinine, Ser: 0.91 mg/dL (ref 0.50–1.10)
GFR calc non Af Amer: 87 mL/min — ABNORMAL LOW (ref >90)
Potassium: 3.3 mEq/L — ABNORMAL LOW (ref 3.5–5.1)
Total Bilirubin: 0.2 mg/dL — ABNORMAL LOW (ref 0.3–1.2)

## 2013-03-07 LAB — RAPID URINE DRUG SCREEN, HOSP PERFORMED
Amphetamines: POSITIVE — AB
Barbiturates: NOT DETECTED
Benzodiazepines: POSITIVE — AB
Cocaine: NOT DETECTED
Tetrahydrocannabinol: NOT DETECTED

## 2013-03-07 LAB — GLUCOSE, CAPILLARY: Glucose-Capillary: 125 mg/dL — ABNORMAL HIGH (ref 70–99)

## 2013-03-07 LAB — ACETAMINOPHEN LEVEL: Acetaminophen (Tylenol), Serum: <15 ug/mL (ref 10–30)

## 2013-03-07 MED ORDER — LORAZEPAM 1 MG PO TABS
1.0000 mg | ORAL_TABLET | Freq: Three times a day (TID) | ORAL | Status: DC | PRN
  Administered 2013-03-08: 1 mg via ORAL
  Filled 2013-03-07: qty 1

## 2013-03-07 MED ORDER — NICOTINE 21 MG/24HR TD PT24
21.0000 mg | MEDICATED_PATCH | Freq: Every day | TRANSDERMAL | Status: DC
  Administered 2013-03-07 – 2013-03-08 (×2): 21 mg via TRANSDERMAL
  Filled 2013-03-07 (×2): qty 1

## 2013-03-07 MED ORDER — ZOLPIDEM TARTRATE 5 MG PO TABS: 5.0000 mg | ORAL_TABLET | Freq: Every evening | ORAL | Status: DC | PRN

## 2013-03-07 MED ORDER — ONDANSETRON HCL 4 MG PO TABS: 4.0000 mg | ORAL_TABLET | Freq: Three times a day (TID) | ORAL | Status: DC | PRN

## 2013-03-07 MED ORDER — ALUM & MAG HYDROXIDE-SIMETH 200-200-20 MG/5ML PO SUSP: 30.0000 mL | ORAL | Status: DC | PRN

## 2013-03-07 MED ORDER — IBUPROFEN 200 MG PO TABS
600.0000 mg | ORAL_TABLET | Freq: Three times a day (TID) | ORAL | Status: DC | PRN
  Administered 2013-03-08: 600 mg via ORAL
  Filled 2013-03-07: qty 3
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
## 2013-03-07 NOTE — ED Provider Notes (Signed)
CSN: [id number]     Arrival date & time 03/07/13  1914 History   First MD Initiated Contact with Patient 03/07/13 2016     Chief Complaint  Patient presents with  . Medical Clearance   HPI  History provided by the patient and family. Patient is a 27 year old female with history of bipolar disorder with occasions of severe depression and anxiety, OCD who presents with worsening depression and suicidal attempt last evening. The family states patient has had worse depression over the last 3 days. If she does report taking over 10 Klonopin yesterday evening around 9 PM as well as taking alcohol in attempt of suicide. She was not evaluated for this until now. The family is very concerned as the patient has long history of depression area she has previously been on medications and followed outpatient by psychiatrist has not been followed or taken any medications for the past 5 months. She does have a brother who died from suicide and depression runs in the family. Patient denies any other complaints. Denies any other drug use. No other aggravating or alleviating factors. No other associated symptoms.    Past Medical History  Diagnosis Date  . No pertinent past medical history   . Depression   . OCD (obsessive compulsive disorder)   . Anxiety    Past Surgical History  Procedure Laterality Date  . No past surgeries    . Tubal ligation  02/18/2012    Procedure: POST PARTUM TUBAL LIGATION;  Surgeon: Allie Bossier, MD;  Location: [facility];  Service: Gynecology;  Laterality: Bilateral;   History reviewed. No pertinent family history. History  Substance Use Topics  . Smoking status: Current Every Day Smoker -- 0.50 packs/day  . Smokeless tobacco: Never Used  . Alcohol Use: Yes     Comment: occassionally   OB History   Grav Para Term Preterm Abortions TAB SAB Ect Mult Living   3 3 3  0 0  0   3     Review of Systems  Constitutional: Negative for fever.  Respiratory: Negative for shortness of  breath.   Cardiovascular: Negative for chest pain.  Gastrointestinal: Negative for abdominal pain.  All other systems reviewed and are negative.    Allergies  Review of patient's allergies indicates no known allergies.  Home Medications   Current Outpatient Rx  Name  Route  Sig  Dispense  Refill  . clonazePAM (KLONOPIN) 0.5 MG tablet   Oral   Take 0.5 mg by mouth 2 (two) times daily as needed for anxiety.          BP 98/58  Pulse 78  Temp(Src) 97.9 F (36.6 C) (Oral)  Resp 16  Ht 5\' 6"  (1.676 m)  Wt 123 lb (55.792 kg)  BMI 19.86 kg/m2  SpO2 97%  LMP 02/28/2013 Physical Exam  Nursing note and vitals reviewed. Constitutional: She is oriented to person, place, and time. She appears well-developed and well-nourished. No distress.  HENT:  Head: Normocephalic.  Cardiovascular: Normal rate and regular rhythm.   No murmur heard. Pulmonary/Chest: Effort normal and breath sounds normal. No respiratory distress.  Abdominal: Soft.  Neurological: She is alert and oriented to person, place, and time.  Skin: Skin is warm and dry. No rash noted.  Psychiatric: Her behavior is normal. She is not actively hallucinating. Thought content is not paranoid. She exhibits a depressed mood. She expresses suicidal ideation. She expresses suicidal plans.    ED Course  Procedures   Results for orders  placed during the hospital encounter of 03/07/13  CBC      Result Value Range   WBC 5.7  4.0 - 10.5 K/uL   RBC 4.04  3.87 - 5.11 MIL/uL   Hemoglobin 12.1  12.0 - 15.0 g/dL   HCT 16.1 (*) 09.6 - 04.5 %   MCV 88.9  78.0 - 100.0 fL   MCH 30.0  26.0 - 34.0 pg   MCHC 33.7  30.0 - 36.0 g/dL   RDW 40.9  81.1 - 91.4 %   Platelets 168  150 - 400 K/uL  COMPREHENSIVE METABOLIC PANEL      Result Value Range   Sodium 140  135 - 145 mEq/L   Potassium 3.3 (*) 3.5 - 5.1 mEq/L   Chloride 105  96 - 112 mEq/L   CO2 27  19 - 32 mEq/L   Glucose, Bld 103 (*) 70 - 99 mg/dL   BUN 11  6 - 23 mg/dL    Creatinine, Ser 7.82  0.50 - 1.10 mg/dL   Calcium 9.0  8.4 - 95.6 mg/dL   Total Protein 6.5  6.0 - 8.3 g/dL   Albumin 3.7  3.5 - 5.2 g/dL   AST 12  0 - 37 U/L   ALT 9  0 - 35 U/L   Alkaline Phosphatase 53  39 - 117 U/L   Total Bilirubin 0.2 (*) 0.3 - 1.2 mg/dL   GFR calc non Af Amer 87 (*) >90 mL/min   GFR calc Af Amer >90  >90 mL/min  ETHANOL      Result Value Range   Alcohol, Ethyl (B) <11  0 - 11 mg/dL  ACETAMINOPHEN LEVEL      Result Value Range   Acetaminophen (Tylenol), Serum <15.0  10 - 30 ug/mL  SALICYLATE LEVEL      Result Value Range   Salicylate Lvl <2.0 (*) 2.8 - 20.0 mg/dL  URINE RAPID DRUG SCREEN (HOSP PERFORMED)      Result Value Range   Opiates NONE DETECTED  NONE DETECTED   Cocaine NONE DETECTED  NONE DETECTED   Benzodiazepines POSITIVE (*) NONE DETECTED   Amphetamines POSITIVE (*) NONE DETECTED   Tetrahydrocannabinol NONE DETECTED  NONE DETECTED   Barbiturates NONE DETECTED  NONE DETECTED  GLUCOSE, CAPILLARY      Result Value Range   Glucose-Capillary 125 (*) 70 - 99 mg/dL  POCT PREGNANCY, URINE      Result Value Range   Preg Test, Ur NEGATIVE  NEGATIVE         MDM   1. Overdose, initial encounter   2. Suicide attempt   3. Major depression      Patient seen and evaluated. Patient appears well in no acute distress.  Patient moved to the psych ED. Holding orders in place.    Angus Seller, PA-C 03/08/13 [phone]

## 2013-03-08 ENCOUNTER — Encounter (HOSPITAL_COMMUNITY): Payer: Self-pay | Admitting: Registered Nurse

## 2013-03-08 DIAGNOSIS — F319 Bipolar disorder, unspecified: Secondary | ICD-10-CM | POA: Diagnosis present

## 2013-03-08 DIAGNOSIS — F329 Major depressive disorder, single episode, unspecified: Secondary | ICD-10-CM

## 2013-03-08 DIAGNOSIS — T43502A Poisoning by unspecified antipsychotics and neuroleptics, intentional self-harm, initial encounter: Secondary | ICD-10-CM

## 2013-03-08 DIAGNOSIS — T424X4A Poisoning by benzodiazepines, undetermined, initial encounter: Secondary | ICD-10-CM

## 2013-03-08 DIAGNOSIS — T1491XA Suicide attempt, initial encounter: Secondary | ICD-10-CM | POA: Diagnosis present

## 2013-03-08 DIAGNOSIS — R45851 Suicidal ideations: Secondary | ICD-10-CM

## 2013-03-08 MED ORDER — SODIUM CHLORIDE 0.9 % IV BOLUS (SEPSIS)
1000.0000 mL | Freq: Once | INTRAVENOUS | Status: AC
  Administered 2013-03-08: 1000 mL via INTRAVENOUS

## 2013-03-08 MED ORDER — ARIPIPRAZOLE 15 MG PO TABS
15.0000 mg | ORAL_TABLET | Freq: Every day | ORAL | Status: DC
  Administered 2013-03-08: 15 mg via ORAL
  Filled 2013-03-08: qty 1

## 2013-03-08 NOTE — ED Notes (Signed)
Report given to Cornelius Moras, RN.

## 2013-03-08 NOTE — ED Notes (Signed)
Pt complained of anxiety and was given ativan prn.

## 2013-03-08 NOTE — ED Notes (Signed)
Lauren, RN gave me room assignment 1 to take patient to.

## 2013-03-08 NOTE — ED Notes (Signed)
Pt pleasant and cooperative with staff. No s/s of distress noted.

## 2013-03-08 NOTE — ED Notes (Addendum)
Patient was standing in front of nurse station with writer at her side. Patient appeared sleepy. Writer ask patient was she ok. Patient stated" I'm just sleepy". Writer watching patient as she was signing discharge papers and patient being to have a near syncopal episode.Patient was assisted to her knees by Clinical research associate. Writer and Vernita,MHTassisted patient back to her room and orthostatic vital signs done.

## 2013-03-08 NOTE — BH Assessment (Signed)
[facility] Assessment Progress Note  Per Thurman Coyer, RN, Administrative Coordinator, pt accepted to [facility] to the service of Leata Mouse, MD, Rm [phone].  Pt signed Voluntary Admission and Consent for Treatment.  Doylene Canning, MA Triage Specialist 03/08/2013 @ 18:35

## 2013-03-08 NOTE — ED Notes (Addendum)
Dr. Rubin Payor notified of patient near syncopal episode and orthostatic vital signs. Dr. Rubin Payor gave verbal orders to put an IV in patient and give a liter of fluid and move to acute side.

## 2013-03-08 NOTE — ED Provider Notes (Signed)
Medical screening examination/treatment/procedure(s) were performed by non-physician practitioner and as supervising physician I was immediately available for consultation/collaboration.   Enid Skeens, MD 03/08/13 1023
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
## 2013-03-08 NOTE — ED Provider Notes (Addendum)
Patient became lightheaded and dizzy while attempting transfer to [facility]. Will give IV fluids. Lab reviewed.  Juliet Rude. Rubin Payor, MD 03/08/13 2235  Patient feels better after fluid boluses and his been able ambulate. She is tolerated orals and will still be transferred  [employer]. Rubin Payor, MD 03/09/13 [phone]

## 2013-03-08 NOTE — ED Notes (Signed)
Bed: WA01 Expected date:  Expected time:  Means of arrival:  Comments: [facility] ED pt- needs IV, fluid, near syncope

## 2013-03-09 ENCOUNTER — Encounter (HOSPITAL_COMMUNITY): Payer: Self-pay

## 2013-03-09 ENCOUNTER — Inpatient Hospital Stay (HOSPITAL_COMMUNITY)
Admission: AD | Admit: 2013-03-09 | Discharge: 2013-03-15 | DRG: 885 | Disposition: A | Discharge status: Home / Self Care | Payer: 59 | Source: Intra-hospital | Attending: Psychiatry | Admitting: Psychiatry

## 2013-03-09 DIAGNOSIS — F319 Bipolar disorder, unspecified: Secondary | ICD-10-CM | POA: Diagnosis present

## 2013-03-09 DIAGNOSIS — F314 Bipolar disorder, current episode depressed, severe, without psychotic features: Secondary | ICD-10-CM

## 2013-03-09 DIAGNOSIS — F3130 Bipolar disorder, current episode depressed, mild or moderate severity, unspecified: Principal | ICD-10-CM | POA: Diagnosis present

## 2013-03-09 DIAGNOSIS — F331 Major depressive disorder, recurrent, moderate: Secondary | ICD-10-CM | POA: Diagnosis present

## 2013-03-09 DIAGNOSIS — F411 Generalized anxiety disorder: Secondary | ICD-10-CM | POA: Diagnosis present

## 2013-03-09 DIAGNOSIS — F1910 Other psychoactive substance abuse, uncomplicated: Secondary | ICD-10-CM

## 2013-03-09 DIAGNOSIS — T1491XA Suicide attempt, initial encounter: Secondary | ICD-10-CM | POA: Diagnosis present

## 2013-03-09 DIAGNOSIS — F429 Obsessive-compulsive disorder, unspecified: Secondary | ICD-10-CM | POA: Diagnosis present

## 2013-03-09 DIAGNOSIS — Z79899 Other long term (current) drug therapy: Secondary | ICD-10-CM

## 2013-03-09 LAB — COMPREHENSIVE METABOLIC PANEL
AST: 11 U/L (ref 0–37)
Albumin: 3.3 g/dL — ABNORMAL LOW (ref 3.5–5.2)
Alkaline Phosphatase: 45 U/L (ref 39–117)
BUN: 10 mg/dL (ref 6–23)
Chloride: 102 mEq/L (ref 96–112)
Potassium: 3.8 mEq/L (ref 3.5–5.1)
Total Bilirubin: 0.4 mg/dL (ref 0.3–1.2)
Total Protein: 6.2 g/dL (ref 6.0–8.3)

## 2013-03-09 MED ORDER — TRAZODONE HCL 50 MG PO TABS
50.0000 mg | ORAL_TABLET | Freq: Every evening | ORAL | Status: DC | PRN
  Administered 2013-03-10 – 2013-03-14 (×4): 50 mg via ORAL
  Filled 2013-03-09 (×4): qty 1

## 2013-03-09 MED ORDER — ALUM & MAG HYDROXIDE-SIMETH 200-200-20 MG/5ML PO SUSP: 30.0000 mL | ORAL | Status: DC | PRN

## 2013-03-09 MED ORDER — ARIPIPRAZOLE 5 MG PO TABS
5.0000 mg | ORAL_TABLET | Freq: Two times a day (BID) | ORAL | Status: DC
  Administered 2013-03-09 – 2013-03-10 (×3): 5 mg via ORAL
  Filled 2013-03-09 (×5): qty 1

## 2013-03-09 MED ORDER — MAGNESIUM HYDROXIDE 400 MG/5ML PO SUSP: 30.0000 mL | Freq: Every day | ORAL | Status: DC | PRN

## 2013-03-09 MED ORDER — ACETAMINOPHEN 325 MG PO TABS: 650.0000 mg | ORAL_TABLET | Freq: Four times a day (QID) | ORAL | Status: DC | PRN

## 2013-03-09 NOTE — Progress Notes (Signed)
Adult Psychoeducational Group Note  Date:  03/09/2013 Time:  9:00 AM  Group Topic/Focus:  Dimensions of Wellness:   The focus of this group is to introduce the topic of wellness and discuss the role each dimension of wellness plays in total health.  Participation Level:  Active  Participation Quality:  Appropriate and Drowsy  Affect:  Appropriate  Cognitive:  Alert and Appropriate  Insight: Appropriate  Engagement in Group:  Engaged  Modes of Intervention:  Discussion  Additional Comments:  Pt. participated in warm-up exercises.  Harold Barban E 03/09/2013, 10:12 AM

## 2013-03-09 NOTE — ED Notes (Signed)
[facility], PA and Shann Medal stated that patient could be transferred to [facility] at this time.

## 2013-03-09 NOTE — [facility] Suicide Risk Assessment (Signed)
[facility] INPATIENT:  Family/Significant Other Suicide Prevention Education  Suicide Prevention Education:  Education Completed; Randye Lobo, Husband - [phone]; has been identified by the patient as the family member/significant other with whom the patient will be residing, and identified as the person(s) who will aid the patient in the event of a mental health crisis (suicidal ideations/suicide attempt).  With written consent from the patient, the family member/significant other has been provided the following suicide prevention education, prior to the and/or following the discharge of the patient.  The suicide prevention education provided includes the following:  Suicide risk factors  Suicide prevention and interventions  National Suicide Hotline telephone number  [facility] assessment telephone number  [facility] Emergency Assistance 911  [facility] and/or Residential Mobile Crisis Unit telephone number  Request made of family/significant other to:  Remove weapons (e.g., guns, rifles, knives), all items previously/currently identified as safety concern.  Husband reports patient does not have access to guns.  Remove drugs/medications (over-the-counter, prescriptions, illicit drugs), all items previously/currently identified as a safety concern.  The family member/significant other verbalizes understanding of the suicide prevention education information provided.  The family member/significant other agrees to remove the items of safety concern listed above.  Wynn Banker 03/09/2013, 11:05 AM
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
## 2013-03-09 NOTE — Tx Team (Signed)
Initial Interdisciplinary Treatment Plan  PATIENT STRENGTHS: (choose at least two) Ability for insight Average or above average intelligence Communication skills Work skills  PATIENT STRESSORS: Loss of seperated from husband Marital or family conflict Medication change or noncompliance   PROBLEM LIST: Problem List/Patient Goals Date to be addressed Date deferred Reason deferred Estimated date of resolution  depression 9/1192014     Suicide attempt 9/1192014     anxiety 4/0981191     bipolar 4/[id number]                                    DISCHARGE CRITERIA:  Ability to meet basic life and health needs Adequate post-discharge living arrangements Medical problems require only outpatient monitoring Motivation to continue treatment in a less acute level of care Need for constant or close observation no longer present Reduction of life-threatening or endangering symptoms to within safe limits Verbal commitment to aftercare and medication compliance  PRELIMINARY DISCHARGE PLAN: Attend aftercare/continuing care group Attend PHP/IOP Outpatient therapy Participate in family therapy Return to previous living arrangement Return to previous work or school arrangements  PATIENT/FAMIILY INVOLVEMENT: This treatment plan has been presented to and reviewed with the patient, [place], and/or family member,  The patient and family have been given the opportunity to ask questions and make suggestions.  JEHU-APPIAH, Rabon Scholle K 03/09/2013, 3:01 AM

## 2013-03-09 NOTE — [facility] Suicide Risk Assessment (Signed)
Suicide Risk Assessment  Admission Assessment     Nursing information obtained from:  Patient Demographic factors:  Caucasian Current Mental Status:  Suicidal ideation indicated by patient;Suicide plan;Self-harm thoughts;Intention to act on suicide plan Loss Factors:  Loss of significant relationship Historical Factors:  Family history of suicide;Family history of mental illness or substance abuse Risk Reduction Factors:  Responsible for children under 74 years of age;Sense of responsibility to family;Employed;Positive social support  CLINICAL FACTORS:   Severe Anxiety and/or Agitation Bipolar Disorder:   Mixed State Depression:   Anhedonia Comorbid alcohol abuse/dependence Hopelessness Impulsivity Insomnia Recent sense of peace/wellbeing Severe Obsessive-Compulsive Disorder More than one psychiatric diagnosis Unstable or Poor Therapeutic Relationship Previous Psychiatric Diagnoses and Treatments  COGNITIVE FEATURES THAT CONTRIBUTE TO RISK:  Closed-mindedness Loss of executive function Polarized thinking Thought constriction (tunnel vision)    SUICIDE RISK:   Severe:  Frequent, intense, and enduring suicidal ideation, specific plan, no subjective intent, but some objective markers of intent (i.e., choice of lethal method), the method is accessible, some limited preparatory behavior, evidence of impaired self-control, severe dysphoria/symptomatology, multiple risk factors present, and few if any protective factors, particularly a lack of social support.  PLAN OF CARE: Patient admitted voluntarily and emergently from [facility] for depression, anxiety and suicidal attempt with overdose on her medication celexa approx 7, ultram 3 or 4 and Klonopin about ten pills with intention to end her life.   I certify that inpatient services furnished can reasonably be expected to improve the patient's condition.  [name]., MD 03/09/2013, 11:27 AM
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
## 2013-03-09 NOTE — Progress Notes (Signed)
Adult Psychoeducational Group Note  Date:  03/09/2013 Time:  10:00am Group Topic/Focus:  Therapeutic activity  Participation Level:  Did Not Attend  Participation Quality:    Affect:    Cognitive:    Insight:   Engagement in Group:    Modes of Intervention:    Additional Comments:  Pt did not attend group Shelly Bombard D 03/09/2013, 1:20 PM

## 2013-03-09 NOTE — Progress Notes (Signed)
Adult Psychoeducational Group Note  Date:  03/09/2013 Time:  3:15 PM  Group Topic/Focus:  Overcoming Stress:   The focus of this group is to define stress and help patients assess their triggers.  Participation Level:  Did Not Attend  Additional Comments:  Pt did not attend group despite being encouraged to do so by staff.  Reinaldo Raddle K 03/09/2013, 3:15 PM

## 2013-03-09 NOTE — Progress Notes (Signed)
D: Patient appropriate and cooperative with staff. Patient's affect/mood is depressed. She reported on the self inventory sheet that her sleep, appetite and ability to pay attention are all poor and energy level is low. Patient rated depression "10" and feelings of hopelessness "1". She voiced that she's not suicidal, but is hearing voices and seeing people that she believes are not there, but are having conversations with them.  A: Support and encouragement provided to patient. Scheduled medications administered per MD orders. Maintain Q15 minute checks for safety.  R: Patient receptive. Denies HI. Patient remains safe.

## 2013-03-09 NOTE — [facility] Counselor (Signed)
Adult Psychosocial Assessment Update Interdisciplinary Team  [facility] admissions[facility]discharges:  Admissions Discharges  Date: 08/29/12 Date:  09/01/12  Date: Date:  Date: Date:  Date: Date:  Date: Date:   Changes since the last Psychosocial Assessment (including adherence to outpatient mental health and/or substance abuse treatment, situational issues contributing to decompensation and/or relapse). Patient reports she stopped her medications several weeks ago and has become increasingly depressed and suicidal.  She reports ODIng on 25 pills following an argument with her husband.  Patient continues to endorse SI but contracts for safety.             Discharge Plan 1. Will you be returning to the same living situation after discharge?   Yes: No:      If no, what is your plan?    No. Patient advised husband wants to live in the home with children and does not want patient to see girls until she is much better. Patient advised of plans to live with her father at dischare.       2. Would you like a referral for services when you are discharged? Yes:     If yes, for what services?  No:       No. Patient was being seen by Dr. Jannifer Franklin at [facility] for medications and [facility] for counseling.       Summary andRecommendations (to be completed by the evaluator) Alexis [name] is a 27 year old Caucasian female admitted with Major Depression  Disorder and a suicide attempt overdosing on pills.  She will benefit from crisis stabilization, evaluation for medication, psycho-education groups for coping skills development, group therapy and case management for discharge planning.                        Signature:  Wynn Banker, 03/09/2013 10:19 AM

## 2013-03-09 NOTE — Progress Notes (Signed)
D   Pt presents as anxious and depressed  She was almost tearful when coming to the medication window   She is cooperative with treatment and went to karoke group  She requested information on her medication abilify   She is passive si and contracts for safety   She reports she has been sleeping too much and did not think she would need a sleeping pill tonight A   Verbal support given   Medications administered and effectiveness monitored   Q 15 min checks R   Pt safe at present

## 2013-03-09 NOTE — Progress Notes (Signed)
The focus of this group is to educate the patient on the purpose and policies of crisis stabilization and provide a format to answer questions about their admission.  The group details unit policies and expectations of patients while admitted.  Patient attended 0900 nurse education orientation group.  Patient actively participated, appropriate affect, alert, appropriate insight and engagement.  Patient will work on goals for discharge today.  

## 2013-03-09 NOTE — Progress Notes (Signed)
Patient ID: Alexis [name], female   DOB: [birth year], 27 y.o.   MRN: [id number] Admission note: D:Patient is a  Voluntary admission in no acute distress for suicide attempt two days ago. Pt stated increased depression after separating from husband. Pt stated she took 20-25 klonopin and celexa. Pt reported husband found the pill bottle and called the ambulance. Pt stated husband does not want her back in their home or around the children till she gets help. Pt report a family history of suicide. pt brother committed suicide but pt stated her parent are not very supportive. Pt endorses active suicidal ideation stating "if I get the chance I will do it". Pt endorses visual hallucination seeing and talking to her friend that is not in the room with her. Pt has a history of depression, anxiety, and bipolar. Pt sees Dr. Jannifer Franklin outpatient and stated her medications were not effective so she stop taking them.  A: Pt admitted to unit per protocol, skin assessment and belonging search done. No skin issues noted. Consent signed by pt. Pt educated on therapeutic milieu rules. Pt was introduced to milieu by nursing staff. Fall risk safety plan explained to the patient. Food and drink offered to pt. 15 minutes checks started for safety.  R: Pt was receptive to education. Writer offered support. Pt declined food and water.

## 2013-03-10 DIAGNOSIS — X838XXA Intentional self-harm by other specified means, initial encounter: Secondary | ICD-10-CM

## 2013-03-10 DIAGNOSIS — F332 Major depressive disorder, recurrent severe without psychotic features: Secondary | ICD-10-CM

## 2013-03-10 DIAGNOSIS — F411 Generalized anxiety disorder: Secondary | ICD-10-CM

## 2013-03-10 MED ORDER — NICOTINE POLACRILEX 2 MG MT GUM
2.0000 mg | CHEWING_GUM | OROMUCOSAL | Status: DC | PRN
  Administered 2013-03-10 – 2013-03-13 (×4): 2 mg via ORAL

## 2013-03-10 MED ORDER — ARIPIPRAZOLE 10 MG PO TABS
10.0000 mg | ORAL_TABLET | Freq: Two times a day (BID) | ORAL | Status: DC
  Administered 2013-03-10 – 2013-03-15 (×10): 10 mg via ORAL
  Filled 2013-03-10 (×12): qty 1

## 2013-03-10 MED ORDER — HYDROXYZINE HCL 50 MG PO TABS
50.0000 mg | ORAL_TABLET | Freq: Two times a day (BID) | ORAL | Status: DC
  Administered 2013-03-10 – 2013-03-15 (×10): 50 mg via ORAL
  Filled 2013-03-10 (×12): qty 1

## 2013-03-10 NOTE — [facility] Group Notes (Signed)
[facility] LCSW Aftercare Discharge Planning Group Note   03/10/2013 11:40 AM  Participation Quality:  Appropriate  Mood/Affect:  Appropriate and Depressed  Depression Rating:  1  Anxiety Rating:  1  Thoughts of Suicide:  No  Will you contract for safety?   NA  Current AVH:  No  Plan for Discharge/Comments:  Patient attending discharge planning group and actively participated in group.  She reports feeling better today.  She advised of outpatient services with Dr. Jannifer Franklin and [facility].  CSW provided all participants with daily workbook and information on services offered by [facility].   Transportation Means:  Patient has transportation.   Supports:  Patient has a good support system.   Berlie Hatchel, Joesph July

## 2013-03-10 NOTE — Progress Notes (Signed)
D: Pt mood is depressed but she seems optimistic about her treatment and getting better. She states she can't wait to go home and see her kids. She interacts well with her peers and has been going to groups. Pt states that she feels the groups are helpful. A: Support given. Verbalization encouraged. Pt encouraged to come to nurse with any concerns.  R: Pt is receptive. No complaints of pain or discomfort at this time. Q15 min checks maintained for safety. Will continue to monitor pt.

## 2013-03-10 NOTE — Progress Notes (Signed)
NUTRITION ASSESSMENT  Pt identified as at risk on the Malnutrition Screen Tool  INTERVENTION: 1. Educated patient on the importance of nutrition and encouraged intake of food and beverages. 2. Discussed weight goals.  NUTRITION DIAGNOSIS: Unintentional weight loss related to sub-optimal intake as evidenced by pt report.   Goal: Pt to meet >/= 90% of their estimated nutrition needs.  Monitor:  PO intake  Assessment:  Patient admitted s/p suicide attempt and bipolar disorder.  Reports good intake currently but decreased prior to admit secondary to working 3rd shift.  Weight loss related to pregnancy weight loss.  Patient reported current weight is her usual prepregnancy weight.  27 y.o. female  Height: Ht Readings from Last 1 Encounters:  03/09/13 5' 5.75" (1.67 m)    Weight: Wt Readings from Last 1 Encounters:  03/09/13 121 lb (54.885 kg)    Weight Hx: Wt Readings from Last 10 Encounters:  03/09/13 121 lb (54.885 kg)  03/07/13 123 lb (55.792 kg)  08/29/12 115 lb (52.164 kg)  02/17/12 145 lb (65.772 kg)  02/17/12 145 lb (65.772 kg)    BMI:  Body mass index is 19.68 kg/(m^2). Pt meets criteria for wnl based on current BMI.  Estimated Nutritional Needs: Kcal: 25-30 kcal/kg Protein: > 1 gram protein/kg Fluid: 1 ml/kcal  Diet Order: General Pt is also offered choice of unit snacks mid-morning and mid-afternoon.  Pt is eating as desired.   Lab results and medications reviewed.   Oran Rein, RD, LDN Clinical Inpatient Dietitian Pager:  [phone] Weekend and after hours pager:  [phone]

## 2013-03-10 NOTE — Tx Team (Signed)
Interdisciplinary Treatment Plan Update   Date Reviewed:  03/10/2013  Time Reviewed:  9:47 AM  Progress in Treatment:   Attending groups: Yes Participating in groups: Yes Taking medication as prescribed: Yes  Tolerating medication: Yes Family/Significant other contact made: Yes, contact made with husband  Patient understands diagnosis: Yes  Discussing patient identified problems/goals with staff: Yes Medical problems stabilized or resolved: Yes Denies suicidal/homicidal ideation: Yes Patient has not harmed self or others: Yes  For review of initial/current patient goals, please see plan of care.  Estimated Length of Stay:  3-4 days  Reasons for Continued Hospitalization:  Anxiety Depression Medication stabilization  New Problems/Goals identified:    Discharge Plan or Barriers:   Home with outpatient follow up [facility] and [facility]  Additional Comments:  Patient admitted following and suicide attempt and with MDD.  Attendees:  Patient:  03/10/2013 9:47 AM   Signature: Mervyn Gay, MD 03/10/2013 9:47 AM  Signature:  Verne Spurr, PA 03/10/2013 9:47 AM  Signature: Harold Barban, RN 03/10/2013 9:47 AM  Signature: Cheron Every, RN 03/10/2013 9:47 AM  Signature:   03/10/2013 9:47 AM  Signature:  Juline Patch, LCSW 03/10/2013 9:47 AM  Signature:  Reyes Ivan, LCSW 03/10/2013 9:47 AM  Signature:  Maseta Dorley,Care Coordinator 03/10/2013 9:47 AM  Signature:   03/10/2013 9:47 AM  Signature:  03/10/2013  9:47 AM  Signature:    Signature:      Scribe for Treatment Team:   Juline Patch,  03/10/2013 9:47 AM

## 2013-03-10 NOTE — [facility] Group Notes (Signed)
[facility] LCSW Group Therapy  Feelings Around Relapse 1:15 -2:30        03/10/2013  2:47 PM   Type of Therapy:  Group Therapy  Participation Level:  Appropriate  Participation Quality:  Appropriate  Affect:  Appropriate  Cognitive:  Attentive Appropriate  Insight:  Developing/Improving  Engagement in Therapy: Developing/Improving  Modes of Intervention:  Discussion Exploration Problem-Solving Supportive  Summary of Progress/Problems:  The topic for today was feelings around relapse. Patient processed feelings toward relapse and was able to relate to peers.  She advised of knowing she is relapsing into depression when she starts to sleep a lot and keeps her room dark.  She also stated she does not want to be around her children.   Patient identified coping skills that can be used to prevent a relapse.   Wynn Banker 03/10/2013 2:47 PM

## 2013-03-10 NOTE — Progress Notes (Signed)
D: Patient's affect is anxious and mood is depressed. She reported on the self inventory sheet that she slept well, appetite is good, energy level is normal and ability to pay attention is improving. Patient rated depression "8" and feelings of hopelessness "5". She's interactive with peers in the milieu, participating in groups and compliant with medication regimen.  A: Support and encouragement provided to patient. Administered scheduled medications per ordering MD. Monitor Q15 minute checks for safety.  R: Patient receptive. Denies SI/HI, but reports hearing voices and seeing people that are not really present. Patient verbalized that the voices are not telling her to do anything wrong or bad.

## 2013-03-10 NOTE — Progress Notes (Signed)
[facility] MD Progress Note  03/10/2013 11:57 AM Alexis [name]  MRN:  [id number] Subjective:  Patient presented with the depression and suicidal attempt with multiple medications. Patient has been diagnosed with bipolar disorder and generalized anxiety disorder. Patient reported she has been feeling anxious and needed medication to control it. Patient also minimizes her current symptoms of depression and suicidal ideation.  Diagnosis:   DSM5: Schizophrenia Disorders:   Obsessive-Compulsive Disorders:   Trauma-Stressor Disorders:   Substance/Addictive Disorders:   Depressive Disorders:    Axis I: Bipolar, Depressed, Generalized Anxiety Disorder, Major Depression, Recurrent severe and Status post Suicide attempt  ADL's:  Intact  Sleep: Fair  Appetite:  Fair  Suicidal Ideation:  Patient has a suicidal attempt with multiple medications and contracted for safety in the hospital Homicidal Ideation:  Denied AEB (as evidenced by):  Psychiatric Specialty Exam: ROS  Blood pressure 110/76, pulse 79, temperature 98.3 F (36.8 C), temperature source Oral, resp. rate 24, height 5' 5.75" (1.67 m), weight 54.885 kg (121 lb), last menstrual period 02/28/2013, not currently breastfeeding.Body mass index is 19.68 kg/(m^2).  General Appearance: Casual and Fairly Groomed  Patent attorney::  Fair  Speech:  Clear and Coherent  Volume:  Normal  Mood:  Anxious and Depressed  Affect:  Depressed, Flat and Tearful  Thought Process:  Coherent and Goal Directed  Orientation:  Full (Time, Place, and Person)  Thought Content:  Obsessions and Rumination  Suicidal Thoughts:  Yes.  with intent/plan  Homicidal Thoughts:  No  Memory:  Immediate;   Fair  Judgement:  Impaired  Insight:  Lacking  Psychomotor Activity:  Psychomotor Retardation and Restlessness  Concentration:  Fair  Recall:  Fair  Akathisia:  NA  Handed:  Right  AIMS (if indicated):     Assets:  Communication Skills Desire for  Improvement Physical Health Resilience Social Support  Sleep:  Number of Hours: 2.25   Current Medications: Current Facility-Administered Medications  Medication Dose Route Frequency Provider Last Rate Last Dose  . acetaminophen (TYLENOL) tablet 650 mg  650 mg Oral Q6H PRN Evanna Janann August, NP      . alum & mag hydroxide-simeth (MAALOX/MYLANTA) 200-200-20 MG/5ML suspension 30 mL  30 mL Oral Q4H PRN Evanna Janann August, NP      . ARIPiprazole (ABILIFY) tablet 5 mg  5 mg Oral BID [name], MD   5 mg at 03/10/13 0811  . magnesium hydroxide (MILK OF MAGNESIA) suspension 30 mL  30 mL Oral Daily PRN Evanna Janann August, NP      . traZODone (DESYREL) tablet 50 mg  50 mg Oral QHS PRN,MR X 1 Evanna Janann August, NP        Lab Results:  Results for orders placed during the hospital encounter of 03/09/13 (from the past 48 hour(s))  COMPREHENSIVE METABOLIC PANEL     Status: Abnormal   Collection Time    03/09/13  6:20 AM      Result Value Range   Sodium 134 (*) 135 - 145 mEq/L   Potassium 3.8  3.5 - 5.1 mEq/L   Chloride 102  96 - 112 mEq/L   CO2 28  19 - 32 mEq/L   Glucose, Bld 90  70 - 99 mg/dL   BUN 10  6 - 23 mg/dL   Creatinine, Ser 4.09  0.50 - 1.10 mg/dL   Calcium 8.6  8.4 - 81.1 mg/dL   Total Protein 6.2  6.0 - 8.3 g/dL   Albumin 3.3 (*) 3.5 -  5.2 g/dL   AST 11  0 - 37 U/L   ALT 7  0 - 35 U/L   Alkaline Phosphatase 45  39 - 117 U/L   Total Bilirubin 0.4  0.3 - 1.2 mg/dL   GFR calc non Af Amer >90  >90 mL/min   GFR calc Af Amer >90  >90 mL/min   Comment: (NOTE)     The eGFR has been calculated using the CKD EPI equation.     This calculation has not been validated in all clinical situations.     eGFR's persistently <90 mL/min signify possible Chronic Kidney     Disease.     Performed at [facility]    Physical Findings: AIMS: Facial and Oral Movements Muscles of Facial Expression: None, normal Lips and Perioral Area: None, normal Jaw:  None, normal Tongue: None, normal,Extremity Movements Upper (arms, wrists, hands, fingers): None, normal Lower (legs, knees, ankles, toes): None, normal, Trunk Movements Neck, shoulders, hips: None, normal, Overall Severity Severity of abnormal movements (highest score from questions above): None, normal Incapacitation due to abnormal movements: None, normal Patient's awareness of abnormal movements (rate only patient's report): No Awareness, Dental Status Current problems with teeth and/or dentures?: No Does patient usually wear dentures?: No  CIWA:    COWS:     Treatment Plan Summary: Daily contact with patient to assess and evaluate symptoms and progress in treatment Medication management  Plan: Increase Abilify 10 mg twice a day for mood swings and depression Stott Vistaril 50 mg twice daily for anxiety Treatment Plan/Recommendations:   1. Admit for crisis management and stabilization. 2. Medication management to reduce current symptoms to base line and improve the patient's overall level of functioning. 3. Treat health problems as indicated. 4. Develop treatment plan to decrease risk of relapse upon discharge and to reduce the need for readmission. 5. Psycho-social education regarding relapse prevention and self care. 6. Health care follow up as needed for medical problems. 7. Restart home medications where appropriate. Tentative discharge date Tuesday next week  Medical Decision Making Problem Points:  Established problem, worsening (2), Review of last therapy session (1) and Review of psycho-social stressors (1) Data Points:  Review or order clinical lab tests (1) Review or order medicine tests (1) Review of medication regiment & side effects (2) Review of new medications or change in dosage (2)  I certify that inpatient services furnished can reasonably be expected to improve the patient's condition.   Eustace Hur,JANARDHAHA R. 03/10/2013, 11:57 AM

## 2013-03-11 NOTE — Progress Notes (Signed)
Patient ID: Alexis [name], female   DOB: Nov 03, 1985, 27 y.o.   MRN: [id number] D: Pt is awake and active on the unit this AM. Pt denies SI/HI and A/V hallucinations. Pt rates their depression at 1 and hopelessness at 1. Pt writes that she plans to "use coping mechanisms, stay busy so I don't have racing thoughts." Pt is somewhat isolative but she is participating in the milieu.   A: Encouraged pt to discuss feelings with staff and administered medication per MD orders. Writer also encouraged pt to participate in groups.  R: Pt is attending groups and tolerating medications well. Writer will continue to monitor. 15 minute checks are ongoing for safety.

## 2013-03-11 NOTE — Progress Notes (Signed)
Patient ID: Alexis [name], female   DOB: 1986/04/08, 27 y.o.   MRN: [id number] [facility] MD Progress Note  03/11/2013 4:13 PM Alexis [name]  MRN:  [id number] Subjective:   Patient states "I slept pretty good. I am now realizing how to better cope with my anger after attending groups. After an argument with my husband I overdosed and that was a bad decision. I need more structure in my life. I was just sleeping too much and not doing well. I feel like the medicine is really helping me."   Objective:  Patient observed attending groups and interacting with peers. Patient appears to be minimizing her symptoms and is reporting that all her symptoms are better. Patient remains at risk for self harm due to chronic mental illness and marital problems. She is compliant with her medication and denies any adverse effects.   Diagnosis:   DSM5: Schizophrenia Disorders:   Obsessive-Compulsive Disorders:   Trauma-Stressor Disorders:   Substance/Addictive Disorders:   Depressive Disorders:    Axis I: Bipolar, Depressed, Generalized Anxiety Disorder, Major Depression, Recurrent severe and Status post Suicide attempt  ADL's:  Intact  Sleep: Fair  Appetite:  Fair  Suicidal Ideation:  Patient has a suicidal attempt with multiple medications and contracted for safety in the hospital Homicidal Ideation:  Denied AEB (as evidenced by):  Psychiatric Specialty Exam: Review of Systems  Constitutional: Negative.   HENT: Negative.   Eyes: Negative.   Respiratory: Negative.   Cardiovascular: Negative.   Gastrointestinal: Negative.   Genitourinary: Negative.   Musculoskeletal: Negative.   Skin: Negative.   Neurological: Negative.   Endo/Heme/Allergies: Negative.   Psychiatric/Behavioral: Positive for depression. Negative for suicidal ideas, hallucinations, memory loss and substance abuse. The patient is nervous/anxious. The patient does not have insomnia.     Blood pressure 105/71, pulse 84,  temperature 97.5 F (36.4 C), temperature source Oral, resp. rate 18, height 5' 5.75" (1.67 m), weight 54.885 kg (121 lb), last menstrual period 02/28/2013, not currently breastfeeding.Body mass index is 19.68 kg/(m^2).  General Appearance: Casual and Fairly Groomed  Patent attorney::  Fair  Speech:  Clear and Coherent  Volume:  Normal  Mood:  Anxious and Depressed  Affect:  Depressed, Flat and Tearful  Thought Process:  Coherent and Goal Directed  Orientation:  Full (Time, Place, and Person)  Thought Content:  Obsessions and Rumination  Suicidal Thoughts:  Yes.  with intent/plan  Homicidal Thoughts:  No  Memory:  Immediate;   Fair  Judgement:  Impaired  Insight:  Lacking  Psychomotor Activity:  Psychomotor Retardation and Restlessness  Concentration:  Fair  Recall:  Fair  Akathisia:  NA  Handed:  Right  AIMS (if indicated):     Assets:  Communication Skills Desire for Improvement Physical Health Resilience Social Support  Sleep:  Number of Hours: 6.5   Current Medications: Current Facility-Administered Medications  Medication Dose Route Frequency Provider Last Rate Last Dose  . acetaminophen (TYLENOL) tablet 650 mg  650 mg Oral Q6H PRN Evanna Janann August, NP      . alum & mag hydroxide-simeth (MAALOX/MYLANTA) 200-200-20 MG/5ML suspension 30 mL  30 mL Oral Q4H PRN Evanna Janann August, NP      . ARIPiprazole (ABILIFY) tablet 10 mg  10 mg Oral BID [name], MD   10 mg at 03/11/13 0805  . hydrOXYzine (ATARAX/VISTARIL) tablet 50 mg  50 mg Oral BID [name], MD   50 mg at 03/11/13 0805  . magnesium hydroxide (MILK  OF MAGNESIA) suspension 30 mL  30 mL Oral Daily PRN Evanna Janann August, NP      . nicotine polacrilex (NICORETTE) gum 2 mg  2 mg Oral PRN [name], MD   2 mg at 03/11/13 1243  . traZODone (DESYREL) tablet 50 mg  50 mg Oral QHS PRN,MR X 1 Evanna Cori Merry Proud, NP   50 mg at 03/10/13 2101    Lab Results:  No results found  for this or any previous visit (from the past 48 hour(s)).  Physical Findings: AIMS: Facial and Oral Movements Muscles of Facial Expression: None, normal Lips and Perioral Area: None, normal Jaw: None, normal Tongue: None, normal,Extremity Movements Upper (arms, wrists, hands, fingers): None, normal Lower (legs, knees, ankles, toes): None, normal, Trunk Movements Neck, shoulders, hips: None, normal, Overall Severity Severity of abnormal movements (highest score from questions above): None, normal Incapacitation due to abnormal movements: None, normal Patient's awareness of abnormal movements (rate only patient's report): No Awareness, Dental Status Current problems with teeth and/or dentures?: No Does patient usually wear dentures?: No  CIWA:    COWS:     Treatment Plan Summary: Daily contact with patient to assess and evaluate symptoms and progress in treatment Medication management  Plan:  1. Continue crisis management and stabilization. 2. Medication management to reduce current symptoms to base line and improve the patient's overall level of functioning. 3. Treat health problems as indicated. 4. Develop treatment plan to decrease risk of relapse upon discharge and to reduce the need for readmission. 5. Psycho-social education regarding relapse prevention and self care. 6. Health care follow up as needed for medical problems. 7. Restart home medications where appropriate. Tentative discharge date Tuesday next week 8. Continue Abilify 10 mg BID for mood stability and depression. Continue Vistaril 50 mg BID for anxiety.  Medical Decision Making Problem Points:  Established problem, worsening (2), Review of last therapy session (1) and Review of psycho-social stressors (1) Data Points:  Review or order clinical lab tests (1) Review or order medicine tests (1) Review of medication regiment & side effects (2) Review of new medications or change in dosage (2)  I certify that  inpatient services furnished can reasonably be expected to improve the patient's condition.   DAVIS, LAURA NP-C 03/11/2013, 4:13 PM  Reviewed note, agree with findings and plan.  Jacqulyn Cane, M.D.  03/11/2013 11:21 PM

## 2013-03-11 NOTE — Progress Notes (Signed)
[facility] Group Notes:  (Nursing/MHT/Case Management/Adjunct)  Date:  03/10/2013 Time:  2000  Type of Therapy:  Psychoeducational Skills  Participation Level:  Active  Participation Quality:  Appropriate  Affect:  Appropriate  Cognitive:  Appropriate  Insight:  Good  Engagement in Group:  Engaged  Modes of Intervention:  Education  Summary of Progress/Problems:The patient verbalized with the group that she had a frustrating day. She wishes to be discharged and misses her two year old child. Her goal for tomorrow is to have a better attitude than today.   Hazle Coca S 03/11/2013, 12:38 AM

## 2013-03-11 NOTE — Progress Notes (Signed)
Adult Psychoeducational Group Note  Date:  03/11/2013 Time:  0900  Group Topic/Focus:  Self Inventory  Participation Level:  Active  Participation Quality:  Appropriate  Affect:  Anxious and Appropriate  Cognitive:  Appropriate  Insight: Appropriate  Engagement in Group:  Engaged  Modes of Intervention:  Discussion  Additional Comments:    [name], [name] [name] 03/11/2013, 9:54 AM

## 2013-03-11 NOTE — Progress Notes (Signed)
Adult Psychoeducational Group Note  Date:  03/11/2013 Time:  1300  Group Topic/Focus:  Making Healthy Choices:   The focus of this group is to help patients identify negative/unhealthy choices they were using prior to admission and identify positive/healthier coping strategies to replace them upon discharge.  Participation Level:  Minimal  Participation Quality:  Appropriate  Affect:  Depressed and Flat  Cognitive:  Appropriate  Insight: Improving  Engagement in Group:  Engaged and Limited  Modes of Intervention:  Discussion and Education  Additional Comments:  Pt participated in discussion of healthy coping skills  Inita Uram [name] 03/11/2013, 2:29 PM

## 2013-03-11 NOTE — [facility] Group Notes (Signed)
[facility] Group Notes:  (Clinical Social Work)  03/11/2013   3:00-4:00PM  Summary of Progress/Problems:   The main focus of today's process group was for the patient to identify ways in which they have sabotaged their own mental health wellness/recovery.  Motivational interviewing was used to explore the reasons they engage in this behavior, and reasons they may have for wanting to change.  The Stages of Change were explained to the group using a handout, and patients identified where they are with regard to changing self-defeating behaviors.  The patient expressed tearfully that she attempted suicide and would have left behind her husband and 3 small children if she had succeeded.  She is in Preparation stage.  She talked about her isolating herself and not taking care of herself but always caring for others as other self-sabotaging behaviors.  Type of Therapy:  Process Group  Participation Level:  Active  Participation Quality:  Attentive, Sharing and Supportive  Affect:  Anxious  Cognitive:  Oriented  Insight:  Engaged  Engagement in Therapy:  Engaged  Modes of Intervention:  Education, Motivational Interviewing   Ambrose Mantle, LCSW 03/11/2013, 5:37 PM

## 2013-03-12 NOTE — Progress Notes (Signed)
Adult Psychoeducational Group Note  Date:  03/12/2013 Time:  1300  Group Topic/Focus:  Diagnosis Education:   The focus of this group is to discuss the major disorders that patients maybe diagnosed with.  Group discusses the importance of knowing what one's diagnosis is so that one can understand treatment and better advocate for oneself.  Participation Level:  Did Not Attend  Pt decided not to attend   Nandika Stetzer [name] 03/12/2013, 3:03 PM

## 2013-03-12 NOTE — Progress Notes (Signed)
Patient ID: Alexis [name], female   DOB: Sep 03, 1985, 27 y.o.   MRN: [id number] D: Pt is awake and active on the unit this AM. Pt denies SI/HI and A/V hallucinations. Pt rates their depression at 1 and hopelessness at 1. Pt's mood is depressed and her affect is flat/anxious. Pt writes that she wants to "communicate better with loved ones and try to determine my triggers." Pt is coping well with her anxiety and acknowledges that the the Klonopin was causing her more anxiety than it resolved because when she did not have her mood was much worse. No she feels closer to her normal state.   A: Encouraged pt to discuss feelings with staff and administered medication per MD orders. Writer also encouraged pt to participate in groups.  R: Pt is attending groups and tolerating medications well. Writer will continue to monitor. 15 minute checks are ongoing for safety.

## 2013-03-12 NOTE — Progress Notes (Signed)
Adult Psychoeducational Group Note  Date:  03/12/2013 Time:  0900  Group Topic/Focus:  Identifying Needs:   The focus of this group is to help patients identify their personal needs that have been historically problematic and identify healthy behaviors to address their needs.  Participation Level:  Active  Participation Quality:  Appropriate, Attentive and Sharing  Affect:  Appropriate  Cognitive:  Alert and Appropriate  Insight: Good and Improving  Engagement in Group:  Developing/Improving and Engaged  Modes of Intervention:  Discussion, Education and Exploration  Additional Comments:  Pt is sharing openly her insights into the topic as it relates to her anger and depression.  Caisen Mangas [name] 03/12/2013, 10:26 AM

## 2013-03-12 NOTE — Progress Notes (Signed)
[facility] Group Notes:  (Nursing/MHT/Case Management/Adjunct)  Date:  03/11/2013 Time:  2000  Type of Therapy:  Psychoeducational Skills  Participation Level:  Active  Participation Quality:  Attentive  Affect:  Blunted  Cognitive:  Appropriate  Insight:  Good  Engagement in Group:  Engaged  Modes of Intervention:  Education  Summary of Progress/Problems: The patient shared with the group that she had a very good day. To begin with, she expressed that she didn't feel depressed today. Secondly, she stated that she felt less anxious today. Finally, she expressed that she replaced her negative thoughts with positive thinking. Her goal for tomorrow is to stay positive and to work on her discharge plans.   [name] S 03/12/2013, 1:39 AM

## 2013-03-12 NOTE — [facility] Group Notes (Signed)
[facility] Group Notes:  (Clinical Social Work)  03/12/2013   3:00-4:00PM  Summary of Progress/Problems:   The main focus of today's process group was to   identify the patient's current support system and decide on other supports that can be put in place.  The picture on workbook was used to discuss why additional supports are needed, and a hand-out was distributed with four definitions/levels of support, then used to talk about how patients have given and received all different kinds of support.  An emphasis was placed on using counselor, doctor, therapy groups, 12-step groups, and problem-specific support groups to expand supports.  The patient shared that at one point in her life her mother was no longer supportive; however, she talked to her mother about the reasons and was able to reestablish that relationship and support.  Type of Therapy:  Process Group  Participation Level:  Minimal  Participation Quality:  Attentive and Laying down, feeling sick  Affect:  Blunted  Cognitive:  Oriented  Insight:  Developing/Improving  Engagement in Therapy:  Developing/Improving  Modes of Intervention:  Education,  Support and [employer], LCSW 03/12/2013, 2:42 PM

## 2013-03-12 NOTE — Progress Notes (Signed)
Patient ID: Alexis [name], female   DOB: June 19, 1986, 27 y.o.   MRN: [id number] [facility] MD Progress Note  03/12/2013 3:22 PM Alexis [name]  MRN:  [id number]  Subjective:   The patient was admitted after a suicide attempt with 10 mg of clonazepam.  The patient reports that this first actual attempt to kill herself per patient report. She has been admitted in March 2014 after her husband left.  She admits this last attempt occurred after and argument after her husband due to obsessing about how her family was judging her ability to arrange her child's birthday party. She reports taking out her frustration on her husband.  She sees Dr. Jannifer Franklin and was on Celexa, ripserdal and clonazepam 3-4 months ago and stopped the medication as she felt it was not helping, which led to a worsening of mood. She feels she has been doing well with Abilify and denies any suicidal thoughts, obsessions or paranoia.  She has some anxiety which she feels has improved with Vistaril 50 mg.  She reports she is taking her medications and denies any side effects.  Objective:  Patient observed attending groups and interacting with peers. Patient appears to be minimizing her symptoms and is reporting that all her symptoms are better. Patient remains at risk for self harm due to chronic mental illness and marital problems. Diagnosis:   DSM5:   Axis I: Generalized Anxiety Disorder and Bipolar I Disorder, most recent episode depressed without mention of psychotic features; Status post Suicide attempt  ADL's:  Intact  Sleep: Fair  Appetite:  Fair  Suicidal Ideation:  Patient has a suicidal attempt with multiple medications and contracted for safety in the hospital Homicidal Ideation:  Denied AEB (as evidenced by):  Psychiatric Specialty Exam: Review of Systems  Constitutional: Negative.   HENT: Negative.   Eyes: Negative.   Respiratory: Negative.   Cardiovascular: Negative.   Gastrointestinal: Negative.    Genitourinary: Negative.   Musculoskeletal: Negative.   Skin: Negative.   Neurological: Negative.   Endo/Heme/Allergies: Negative.   Psychiatric/Behavioral: Positive for depression. Negative for suicidal ideas, hallucinations, memory loss and substance abuse. The patient is nervous/anxious. The patient does not have insomnia.     Blood pressure 106/68, pulse 77, temperature 97.7 F (36.5 C), temperature source Oral, resp. rate 18, height 5' 5.75" (1.67 m), weight 54.885 kg (121 lb), last menstrual period 02/28/2013, not currently breastfeeding.Body mass index is 19.68 kg/(m^2).  General Appearance: Casual and Fairly Groomed  Patent attorney::  Fair  Speech:  Clear and Coherent  Volume:  Normal  Mood:  Anxious and Depressed  Affect:  Depressed, Flat and Tearful  Thought Process:  Coherent and Goal Directed  Orientation:  Full (Time, Place, and Person)  Thought Content:  Obsessions and Rumination  Suicidal Thoughts: Contracts for safety on the unit.  Homicidal Thoughts:  No  Memory:  Immediate;   Good Recent;   Good Remote;   Good  Judgement:  Impaired  Insight:  Lacking  Psychomotor Activity:  Psychomotor Retardation and Restlessness  Concentration:  Fair  Recall:  Fair  Akathisia:  NA  Handed:  Right  AIMS (if indicated):     Assets:  Communication Skills Desire for Improvement Physical Health Resilience Social Support  Sleep:  Number of Hours: 6.75   Current Medications: Current Facility-Administered Medications  Medication Dose Route Frequency Provider Last Rate Last Dose  . acetaminophen (TYLENOL) tablet 650 mg  650 mg Oral Q6H PRN Evanna Janann August, NP      .  alum & mag hydroxide-simeth (MAALOX/MYLANTA) 200-200-20 MG/5ML suspension 30 mL  30 mL Oral Q4H PRN Evanna Janann August, NP      . ARIPiprazole (ABILIFY) tablet 10 mg  10 mg Oral BID [name], MD   10 mg at 03/12/13 0816  . hydrOXYzine (ATARAX/VISTARIL) tablet 50 mg  50 mg Oral BID [name], MD   50 mg at 03/12/13 0816  . magnesium hydroxide (MILK OF MAGNESIA) suspension 30 mL  30 mL Oral Daily PRN Evanna Janann August, NP      . nicotine polacrilex (NICORETTE) gum 2 mg  2 mg Oral PRN [name], MD   2 mg at 03/11/13 1243  . traZODone (DESYREL) tablet 50 mg  50 mg Oral QHS PRN,MR X 1 Evanna Cori Merry Proud, NP   50 mg at 03/10/13 2101    Lab Results:  No results found for this or any previous visit (from the past 48 hour(s)).  Physical Findings: AIMS: Facial and Oral Movements Muscles of Facial Expression: None, normal Lips and Perioral Area: None, normal Jaw: None, normal Tongue: None, normal,Extremity Movements Upper (arms, wrists, hands, fingers): None, normal Lower (legs, knees, ankles, toes): None, normal, Trunk Movements Neck, shoulders, hips: None, normal, Overall Severity Severity of abnormal movements (highest score from questions above): None, normal Incapacitation due to abnormal movements: None, normal Patient's awareness of abnormal movements (rate only patient's report): No Awareness, Dental Status Current problems with teeth and/or dentures?: No Does patient usually wear dentures?: No  CIWA:    COWS:     Treatment Plan Summary: Daily contact with patient to assess and evaluate symptoms and progress in treatment Medication management  Plan:  1. Continue crisis management and stabilization. 2. Medication management to reduce current symptoms to base line and improve the patient's overall level of functioning. 3. Treat health problems as indicated. 4. Develop treatment plan to decrease risk of relapse upon discharge and to reduce the need for readmission. 5. Psycho-social education regarding relapse prevention and self care. 6. Health care follow up as needed for medical problems. 7. Restart home medications where appropriate. Tentative discharge date Tuesday next week 8. Continue Abilify 10 mg BID for mood stability and  depression. Increase Vistaril 50 mg QID for anxiety. Patient is not interested in a benzodiazepine.  Medical Decision Making Problem Points:  Established problem, worsening (2), Review of last therapy session (1) and Review of psycho-social stressors (1) Data Points:  Review or order clinical lab tests (1) Review or order medicine tests (1) Review of medication regiment & side effects (2) Review of new medications or change in dosage (2)  I certify that inpatient services furnished can reasonably be expected to improve the patient's condition.    Jacqulyn Cane, M.D.  03/12/2013 3:22 PM

## 2013-03-12 NOTE — Progress Notes (Signed)
D: Pt mood is depressed. Patient rates her depression 3/10. Patient is attending groups and interacts well within the milieu. A: Support given. Verbalization encouraged. Medication given as prescribed. Pt encouraged to come to nurse with any concerns.  R: Pt is receptive. No complaints of pain or discomfort at this time. Q15 min checks maintained for safety. Will continue to monitor pt

## 2013-03-13 NOTE — Progress Notes (Addendum)
Pt appears very withdrawn and depressed stating,"I just want to get out of here." Pt is concerned about her children and daycare.She stated they can only stay in daycare for three days. Husband phoned and stated his wife called him yelling and screaming on the phone that she wants a separation. The husband stated yesterday she seemed fine but today the mood swings are back. Pt contracts for safety. She did minimize her overdose by stating ,"I did take pills but it was a low dose of them and had gotten out of my system before the police came. Pt does contract for safety. She appears very anxious. She rates her depression a 0/10 and her hopelessness a 0/10.She would like to learn how to control her anger and be more aware of her triggers. Pt states she has been married to the current husband times 2 years. She stated she has children ages 59,37 and 53 years old.

## 2013-03-13 NOTE — Progress Notes (Addendum)
D: Patient denies SI/HI/AVH. Patient rates hopelessness as 0,  depression as 0, and anxiety as 0.  Patient affect is appropriate. Mood is depressed.  Pt states, "I've had a great day.  I hope I get to go home tomorrow."  Patient did attend evening group. Patient visible on the milieu. No distress noted. A: Support and encouragement offered. Scheduled medications given to pt. Q 15 min checks continued for patient safety. R: Patient receptive. Patient remains safe on the unit.

## 2013-03-13 NOTE — [facility] Group Notes (Signed)
[facility] LCSW Aftercare Discharge Planning Group Note   03/13/2013 8:45 AM  Participation Quality:  Alert and Appropriate   Mood/Affect:  Appropriate and Calm  Depression Rating:  0  Anxiety Rating:  0  Thoughts of Suicide:  Pt denies SI/HI  Will you contract for safety?   Yes  Current AVH:  Pt denies   Plan for Discharge/Comments:  Pt attended discharge planning group and actively participated in group.  CSW provided pt with today's workbook.  Pt states that will return home in [place].  Pt states that she follows up with [facility] for medication management.  No further needs voiced by pt at this time.    Transportation Means:  Pt reports access to transportation - reports father will pick pt up  Supports: No supports mentioned  Reyes Ivan, LCSWA 03/13/2013 9:55 AM

## 2013-03-13 NOTE — Tx Team (Addendum)
Interdisciplinary Treatment Plan Update (Adult)  Date: 03/13/2013  Time Reviewed:  9:45 AM  Progress in Treatment: Attending groups: Yes Participating in groups:  Yes Taking medication as prescribed:  Yes Tolerating medication:  Yes Family/Significant othe contact made: Yes Patient understands diagnosis:  Yes Discussing patient identified problems/goals with staff:  Yes Medical problems stabilized or resolved:  Yes Denies suicidal/homicidal ideation: Yes Issues/concerns per patient self-inventory:  Yes Other:  New problem(s) identified: N/A  Discharge Plan or Barriers: Pt has follow up scheduled with [facility] and [facility] for medication management and therapy.    Reason for Continuation of Hospitalization: Anxiety Depression Medication Stabilization  Comments: N/A  Estimated length of stay: 1-2 days, possible d/c tomorrow  For review of initial/current patient goals, please see plan of care.  Attendees: Patient:     Family:     Physician:  Dr. Javier Glazier 03/13/2013 12:40 PM   Nursing:   Neill Loft, RN 03/13/2013 12:40 PM   Clinical Social Worker:  Reyes Ivan, LCSWA 03/13/2013 12:40 PM   Other: Verne Spurr, PA 03/13/2013 12:40 PM   Other:   Other:    Other:     Other:    Other:    Other:    Other:    Other:    Other:     Scribe for Treatment Team:   Carmina Miller, 03/13/2013 12:40 PM

## 2013-03-13 NOTE — [facility] Group Notes (Signed)
[facility] LCSW Group Therapy  03/13/2013  1:15 PM   Type of Therapy:  Group Therapy  Participation Level:  Active  Participation Quality:  Appropriate and Attentive  Affect:  Appropriate, Irritable  Cognitive:  Alert and Appropriate  Insight:  Developing/Improving and Engaged  Engagement in Therapy:  Developing/Improving and Engaged  Modes of Intervention:  Clarification, Confrontation, Discussion, Education, Exploration, Limit-setting, Orientation, Problem-solving, Rapport Building, Dance movement psychotherapist, Socialization and Support  Summary of Progress/Problems: Pt identified obstacles faced currently and processed barriers involved in overcoming these obstacles. Pt identified steps necessary for overcoming these obstacles and explored motivation (internal and external) for facing these difficulties head on. Pt further identified one area of concern in their lives and chose a goal to focus on for today.   Pt states that her biggest obstacle is her anger.  Pt states that she yells and hits when she is angry, but reports not having negative consequences from her anger.  Pt states that she is working on overcoming this by utilizing coping skills learned from here, such as deep breathing.  Pt actively participated and was engaged in group discussion.  Pt did voice being upset that she wasn't being discharged today but is coping with it by using her coping skills.    Alexis [name], LCSWA 03/13/2013 2:42 PM

## 2013-03-13 NOTE — Progress Notes (Signed)
Patient ID: Alexis [name], female   DOB: 1986-05-23, 27 y.o.   MRN: [id number]  D: Patient with dull, flat affect endorsing depression. Minimal interaction with peers. A: Monitor Q 15 minutes for safety, encourage staff/peer interaction, medication compliance, and group participation. R: Pt denies SI/HI and states she is hoping to go home in the am.

## 2013-03-13 NOTE — Progress Notes (Signed)
Psychoeducational Group Note  Date:  03/12/2013 Time:  2000  Group Topic/Focus:  Wrap-Up Group:   The focus of this group is to help patients review their daily goal of treatment and discuss progress on daily workbooks.  Participation Level: Did Not Attend  Participation Quality:  Not Applicable  Affect:  Not Applicable  Cognitive:  Not Applicable  Insight:  Not Applicable  Engagement in Group: Not Applicable  Additional Comments:  The patient did not attend group this evening.   Kailyn Vanderslice S 03/13/2013, 1:54 AM

## 2013-03-13 NOTE — Progress Notes (Signed)
Patient ID: Alexis [name], female   DOB: [birth year], 27 y.o.   MRN: [id number] [facility] MD Progress Note  03/13/2013 4:15 PM Alexis [name]  MRN:  [id number]  Subjective:  Patient is seen today and reports she is well. States "I'm good!." Reports sleeping well, appetite is good. Rates her depression and her anxiety as low, reporting that she misses her children. Patient observed attending groups and interacting with peers. Patient appears to be minimizing her symptoms and is reporting that all her symptoms are better. Patient admitted for serious suicidal attempt with multiple medication overdose and remains at risk for self harm due to chronic mental illness and marital problems.   Diagnosis:   DSM5: Axis I: Generalized Anxiety Disorder and Bipolar I Disorder, most recent episode depressed without mention of psychotic features; Status post Suicide attempt  ADL's:  Intact  Sleep: Wonderful  Appetite:  Fair  Suicidal Ideation:  Patient has a suicidal attempt with multiple medications and contracted for safety in the hospital Homicidal Ideation:  Denied AEB (as evidenced by):  Psychiatric Specialty Exam: Review of Systems  Constitutional: Negative.  Negative for fever, chills, weight loss, malaise/fatigue and diaphoresis.  HENT: Negative.  Negative for congestion and sore throat.   Eyes: Negative.  Negative for blurred vision, double vision and photophobia.  Respiratory: Negative.  Negative for cough, shortness of breath and wheezing.   Cardiovascular: Negative.  Negative for chest pain, palpitations and PND.  Gastrointestinal: Negative.  Negative for heartburn, nausea, vomiting, abdominal pain, diarrhea and constipation.  Genitourinary: Negative.   Musculoskeletal: Negative.  Negative for myalgias, joint pain and falls.  Skin: Negative.   Neurological: Negative.  Negative for dizziness, tingling, tremors, sensory change, speech change, focal weakness, seizures, loss of  consciousness, weakness and headaches.  Endo/Heme/Allergies: Negative.  Negative for polydipsia. Does not bruise/bleed easily.  Psychiatric/Behavioral: Positive for depression. Negative for suicidal ideas, hallucinations, memory loss and substance abuse. The patient is nervous/anxious. The patient does not have insomnia.     Blood pressure 112/73, pulse 98, temperature 97.6 F (36.4 C), temperature source Oral, resp. rate 18, height 5' 5.75" (1.67 m), weight 54.885 kg (121 lb), last menstrual period 02/28/2013, not currently breastfeeding.Body mass index is 19.68 kg/(m^2).  General Appearance: disheveled  Eye Contact::  good  Speech:  Clear and Coherent  Volume:  Normal  Mood:  Anxious and Depressed  Affect:  Euthymic, positive, up beat, perky  Thought Process:  Coherent and Goal Directed  Orientation:  Full (Time, Place, and Person)  Thought Content:  Goal directed  Suicidal Thoughts: denies  Homicidal Thoughts:  No  Memory:  Immediate;   Good Recent;   Good Remote;   Good  Judgement:  Impaired  Insight:  Lacking  Psychomotor Activity: normal  Concentration:  Fair  Recall:  Fair  Akathisia:  NA  Handed:  Right  AIMS (if indicated):     Assets:  Communication Skills Desire for Improvement Physical Health Resilience Social Support  Sleep:  Number of Hours: 6.75   Current Medications: Current Facility-Administered Medications  Medication Dose Route Frequency Provider Last Rate Last Dose  . acetaminophen (TYLENOL) tablet 650 mg  650 mg Oral Q6H PRN Evanna Janann August, NP      . alum & mag hydroxide-simeth (MAALOX/MYLANTA) 200-200-20 MG/5ML suspension 30 mL  30 mL Oral Q4H PRN Evanna Janann August, NP      . ARIPiprazole (ABILIFY) tablet 10 mg  10 mg Oral BID [name], MD   10 mg  at 03/13/13 0825  . hydrOXYzine (ATARAX/VISTARIL) tablet 50 mg  50 mg Oral BID [name], MD   50 mg at 03/13/13 0825  . magnesium hydroxide (MILK OF MAGNESIA)  suspension 30 mL  30 mL Oral Daily PRN Evanna Janann August, NP      . nicotine polacrilex (NICORETTE) gum 2 mg  2 mg Oral PRN [name], MD   2 mg at 03/13/13 0826  . traZODone (DESYREL) tablet 50 mg  50 mg Oral QHS PRN,MR X 1 Evanna Cori Merry Proud, NP   50 mg at 03/10/13 2101    Lab Results:  No results found for this or any previous visit (from the past 48 hour(s)).  Physical Findings: AIMS: Facial and Oral Movements Muscles of Facial Expression: None, normal Lips and Perioral Area: None, normal Jaw: None, normal Tongue: None, normal,Extremity Movements Upper (arms, wrists, hands, fingers): None, normal Lower (legs, knees, ankles, toes): None, normal, Trunk Movements Neck, shoulders, hips: None, normal, Overall Severity Severity of abnormal movements (highest score from questions above): None, normal Incapacitation due to abnormal movements: None, normal Patient's awareness of abnormal movements (rate only patient's report): No Awareness, Dental Status Current problems with teeth and/or dentures?: No Does patient usually wear dentures?: Yes  CIWA:    COWS:     Treatment Plan Summary: Daily contact with patient to assess and evaluate symptoms and progress in treatment Medication management  Plan:  1. Continue crisis management and stabilization. 2. Medication management to reduce current symptoms to base line and improve the patient's overall level of functioning. 3. Treat health problems as indicated. 4. Develop treatment plan to decrease risk of relapse upon discharge and to reduce the need for readmission. 5. Psycho-social education regarding relapse prevention and self care. 6. Health care follow up as needed for medical problems. 7. Restart home medications where appropriate. Tentative discharge date Tuesday next week 8. Continue Abilify 10 mg BID for mood stability and depression. Increase Vistaril 50 mg QID for anxiety. Patient is not interested in a  benzodiazepine. 9. Recommend family consult for collateral information.  Medical Decision Making Problem Points:  Established problem, worsening (2), Review of last therapy session (1) and Review of psycho-social stressors (1) Data Points:  Review or order clinical lab tests (1) Review or order medicine tests (1) Review of medication regiment & side effects (2) Review of new medications or change in dosage (2)  I certify that inpatient services furnished can reasonably be expected to improve the patient's condition.   Rona Ravens. [place] RPAC 4:21 PM 03/13/2013  Reviewed the information documented and agree with the treatment plan.  Zaedyn Covin,JANARDHAHA R. 03/13/2013 5:45 PM

## 2013-03-13 NOTE — Progress Notes (Signed)
Recreation Therapy Notes  Date: 09.15.2014 Time: 3:00pm Location: [address] Dayroom  Group Topic: Coping Skills  Goal Area(s) Addresses:  Patient will verbalize importance of recognizing emotions. Patient will identify at least one emotion. Patient will successfully represent varying emotions in pictures or words.   Behavioral Response: Did not attend.     Marykay Lex Latessa Tillis, LRT/CTRS  Jearl Klinefelter 03/13/2013 4:53 PM

## 2013-03-14 NOTE — Progress Notes (Signed)
D: Pt in bed resting with eyes closed. Respirations even and unlabored. Pt appears to be in no signs of distress at this time. A: Q15min checks remains for this pt. R: Pt remains safe at this time.   

## 2013-03-14 NOTE — Progress Notes (Signed)
Writer entered patients room and observed him lying in bed resting but not asleep. Patient reports having had an ok day but was hopeful that she was going to discharge today but wasn't. Patient reported that [facility] Limbo was helpful in calmer her. Patient is hopeful to discharge on tomorrow, reports that she misses her kids. Patient denies si/hi/a/v hallucinations. Support and encouragement offered, safety maintained on unit, will continue to monitor.

## 2013-03-14 NOTE — [facility] Group Notes (Signed)
[facility] LCSW Group Therapy      Feelings About Diagnosis 1:15 - 2:30 PM         03/14/2013     Type of Therapy:  Group Therapy  Participation Level:  Active  Participation Quality:  Appropriate  Affect:  Appropriate  Cognitive:  Alert and Appropriate  Insight:  Developing/Improving and Engaged  Engagement in Therapy:  Developing/Improving and Engaged  Modes of Intervention:  Discussion, Education, Exploration, Problem-Solving, Rapport Building, Support  Summary of Progress/Problems:  Patient actively participated in group. Patient discussed past and present diagnosis and the effects it has had on  life.  Patient talked about family and society being judgmental and the stigma associated with having a mental health diagnosis.  She reports being okay with the diagnosis but has problems dealing with the symptoms.  Wynn Banker 03/14/2013

## 2013-03-14 NOTE — Progress Notes (Signed)
Patient ID: Alexis [name], female   DOB: Aug 13, 1985, 27 y.o.   MRN: [id number] [facility] MD Progress Note  03/14/2013 3:03 PM Alexis [name]  MRN:  [id number]  Subjective:  Patient husband contact the case manager and staff RN regarding safety of the patient and reported her manic symptoms and non compliant with her psych medications. Patient threatened to leave the office on AMA and challenged to take her to court with IVC. She has been sent to case manager to discuss further need of family session and addressing family needs and than she agree to stay one to two more days. Patient husband has court day tomorrow and unable to give time for family meeting. She has no complaints and has been compliant with her medications. She has been attending her group sessions. She denied disturbance of sleep and appetite. She is worried about not seeing her children over seven days. She rates her symptoms of depression, mania and her anxiety as low. Patient is minimizing her symptoms and less motivated for comprehensive treatment. Patient admitted for serious suicidal attempt with multiple medication overdose and remains at risk for self harm due to chronic mental illness and marital problems.   Diagnosis:   DSM5: Axis I: Generalized Anxiety Disorder and Bipolar I Disorder, most recent episode depressed without mention of psychotic features; Status post Suicide attempt  ADL's:  Intact  Sleep: Wonderful  Appetite:  Fair  Suicidal Ideation:  Patient has a suicidal attempt with multiple medications and contracted for safety in the hospital Homicidal Ideation:  Denied AEB (as evidenced by):  Psychiatric Specialty Exam: Review of Systems  Constitutional: Negative.  Negative for fever, chills, weight loss, malaise/fatigue and diaphoresis.  HENT: Negative.  Negative for congestion and sore throat.   Eyes: Negative.  Negative for blurred vision, double vision and photophobia.  Respiratory: Negative.   Negative for cough, shortness of breath and wheezing.   Cardiovascular: Negative.  Negative for chest pain, palpitations and PND.  Gastrointestinal: Negative.  Negative for heartburn, nausea, vomiting, abdominal pain, diarrhea and constipation.  Genitourinary: Negative.   Musculoskeletal: Negative.  Negative for myalgias, joint pain and falls.  Skin: Negative.   Neurological: Negative.  Negative for dizziness, tingling, tremors, sensory change, speech change, focal weakness, seizures, loss of consciousness, weakness and headaches.  Endo/Heme/Allergies: Negative.  Negative for polydipsia. Does not bruise/bleed easily.  Psychiatric/Behavioral: Positive for depression. Negative for suicidal ideas, hallucinations, memory loss and substance abuse. The patient is nervous/anxious. The patient does not have insomnia.     Blood pressure 105/68, pulse 80, temperature 98.2 F (36.8 C), temperature source Oral, resp. rate 16, height 5' 5.75" (1.67 m), weight 54.885 kg (121 lb), last menstrual period 02/28/2013, not currently breastfeeding.Body mass index is 19.68 kg/(m^2).  General Appearance: disheveled  Eye Contact::  good  Speech:  Clear and Coherent  Volume:  Normal  Mood:  Anxious and Depressed  Affect:  Euthymic, positive, up beat, perky  Thought Process:  Coherent and Goal Directed  Orientation:  Full (Time, Place, and Person)  Thought Content:  Goal directed  Suicidal Thoughts: denies  Homicidal Thoughts:  No  Memory:  Immediate;   Good Recent;   Good Remote;   Good  Judgement:  Impaired  Insight:  Lacking  Psychomotor Activity: normal  Concentration:  Fair  Recall:  Fair  Akathisia:  NA  Handed:  Right  AIMS (if indicated):     Assets:  Communication Skills Desire for Improvement Physical Health Resilience Social Support  Sleep:  Number of Hours: 6.75   Current Medications: Current Facility-Administered Medications  Medication Dose Route Frequency Provider Last Rate Last Dose   . acetaminophen (TYLENOL) tablet 650 mg  650 mg Oral Q6H PRN Evanna Janann August, NP      . alum & mag hydroxide-simeth (MAALOX/MYLANTA) 200-200-20 MG/5ML suspension 30 mL  30 mL Oral Q4H PRN Evanna Janann August, NP      . ARIPiprazole (ABILIFY) tablet 10 mg  10 mg Oral BID [name], MD   10 mg at 03/13/13 0825  . hydrOXYzine (ATARAX/VISTARIL) tablet 50 mg  50 mg Oral BID [name], MD   50 mg at 03/13/13 0825  . magnesium hydroxide (MILK OF MAGNESIA) suspension 30 mL  30 mL Oral Daily PRN Evanna Janann August, NP      . nicotine polacrilex (NICORETTE) gum 2 mg  2 mg Oral PRN [name], MD   2 mg at 03/13/13 0826  . traZODone (DESYREL) tablet 50 mg  50 mg Oral QHS PRN,MR X 1 Evanna Cori Merry Proud, NP   50 mg at 03/10/13 2101    Lab Results:  No results found for this or any previous visit (from the past 48 hour(s)).  Physical Findings: AIMS: Facial and Oral Movements Muscles of Facial Expression: None, normal Lips and Perioral Area: None, normal Jaw: None, normal Tongue: None, normal,Extremity Movements Upper (arms, wrists, hands, fingers): None, normal Lower (legs, knees, ankles, toes): None, normal, Trunk Movements Neck, shoulders, hips: None, normal, Overall Severity Severity of abnormal movements (highest score from questions above): None, normal Incapacitation due to abnormal movements: None, normal Patient's awareness of abnormal movements (rate only patient's report): No Awareness, Dental Status Current problems with teeth and/or dentures?: No Does patient usually wear dentures?: Yes  CIWA:    COWS:     Treatment Plan Summary: Daily contact with patient to assess and evaluate symptoms and progress in treatment Medication management  Plan: 1. Continue crisis management and stabilization. 2. Medication management to reduce current symptoms to base line and improve the patient's overall level of functioning. 3. Treat health  problems as indicated. 4. Develop treatment plan to decrease risk of relapse upon discharge and to reduce the need for readmission. 5. Psycho-social education regarding relapse prevention and self care. 6. Health care follow up as needed for medical problems. 7. Restart home medications where appropriate. 8. Continue Abilify 10 mg BID for mood stability and depression and Vistaril 50 mg QID for anxiety. 9. Recommend family session to address safety concern and may plan for IOP as a after care plan.   Medical Decision Making Problem Points:  Established problem, worsening (2), Review of last therapy session (1) and Review of psycho-social stressors (1) Data Points:  Review or order clinical lab tests (1) Review or order medicine tests (1) Review of medication regiment & side effects (2) Review of new medications or change in dosage (2)  I certify that inpatient services furnished can reasonably be expected to improve the patient's condition.    Kyron Schlitt,JANARDHAHA R. 03/14/2013 3:03 PM

## 2013-03-14 NOTE — Progress Notes (Signed)
Patient signed a 72 hour discharge at 03/14/13 at 1306 hrs and it will be up on Friday 03/17/13 at 1306 hrs.

## 2013-03-14 NOTE — Progress Notes (Signed)
Recreation Therapy Notes  Date: 09.16.2014 Time: 2:45pm Location: [address] Dayroom  Group Topic: Animal Assisted Activities (AAA)  Behavioral Response: Did not attend. Per patient consent patient declined all AAA services during admission.   Marykay Lex Morty Ortwein, LRT/CTRS  Jearl Klinefelter 03/14/2013 4:49 PM

## 2013-03-14 NOTE — Progress Notes (Signed)
D: Patient denies SI/HI and A/V hallucinations; patient reports sleep is well; reports appetite is good; reports energy level is high ; reports ability to pay attention is good; rates depression as 0/10; rates hopelessness 0/10; rates anxiety as 0/10;   A: Monitored q 15 minutes; patient encouraged to attend groups; patient educated about medications; patient given medications per physician orders; patient encouraged to express feelings and/or concerns  R: Patient is frustrated because she does not know about her discharge ; patient's interaction with staff and peers is appropriate;  patient is taking medications as prescribed and tolerating medications; patient is attending all groups and engaging

## 2013-03-14 NOTE — Progress Notes (Signed)
Adult Psychoeducational Group Note  Date:  03/14/2013 Time:  11:05 PM  Group Topic/Focus:  Goals Group:   The focus of this group is to help patients establish daily goals to achieve during treatment and discuss how the patient can incorporate goal setting into their daily lives to aide in recovery.  Participation Level:  Active  Participation Quality:  Appropriate  Affect:  Appropriate  Cognitive:  Appropriate  Insight: Appropriate  Engagement in Group:  Engaged  Modes of Intervention:  Discussion  Additional Comments:  Pt stated that she want to learn more about the outpatient.  Terie Purser R 03/14/2013, 11:05 PM

## 2013-03-14 NOTE — Progress Notes (Signed)
Adult Psychoeducational Group Note  Date:  03/14/2013 Time:  11:00am Group Topic/Focus:  Recovery Goals:   The focus of this group is to identify appropriate goals for recovery and establish a plan to achieve them.  Participation Level:  Active  Participation Quality:  Appropriate and Attentive  Affect:  Appropriate  Cognitive:  Alert and Appropriate  Insight: Appropriate  Engagement in Group:  Engaged  Modes of Intervention:  Discussion and Education  Additional Comments:  Pt attended and participated in group. When ask what recovery means to her pt stated control her depression and bipolar and the triggers that set her off.  Shelly Bombard D 03/14/2013, 1:38 PM

## 2013-03-15 MED ORDER — HYDROXYZINE HCL 50 MG PO TABS: 50.0000 mg | ORAL_TABLET | Freq: Two times a day (BID) | ORAL | Status: DC

## 2013-03-15 MED ORDER — ARIPIPRAZOLE 10 MG PO TABS: 10.0000 mg | ORAL_TABLET | Freq: Two times a day (BID) | ORAL | Status: DC

## 2013-03-15 NOTE — Progress Notes (Signed)
[facility] Adult Case Management Discharge Plan :  Will you be returning to the same living situation after discharge: Yes,  Patient to return home with husband At discharge, do you have transportation home?:Yes,  Patient to arrange transportation home. Do you have the ability to pay for your medications:Yes,  Patient can afford medications.  Release of information consent forms completed and in the chart;  Patient's signature needed at discharge.  Patient to Follow up at: Follow-up Information   Follow up with Dr. Jannifer [facility] On 03/24/2013. (Friday, March 24, 2013 at 2:00 PM)    Contact information:   [address] [place], [place]  [address]  [phone]      Follow up with [facility] On 03/23/2013. (You are scheduled with Dereck Ligas on Thursday, March 23, 2013 at 2 PM)    Contact information:   [address] [place], [place]   [address]  [phone]      Follow up with [facility]-  [facility] On 03/17/2013. (Friday, March 17, 2013 at 8:45 AM)    Contact information:   [address] [place], [place]   [address]  [phone]      Patient denies SI/HI:   Patient no longer endorsing SI/HI or other thoughts of self harm.   Safety Planning and Suicide Prevention discussed:  .Reviewed with all patients during discharge planning group   Yamari Ventola, Joesph July 03/15/2013, 10:54 AM

## 2013-03-15 NOTE — BHH Group Notes (Signed)
[facility] LCSW Aftercare Discharge Planning Group Note   03/15/2013 10:45 AM    Participation Quality:  Appropraite  Mood/Affect:  Appropriate  Depression Rating:  0  Anxiety Rating:  0  Thoughts of Suicide:  No  Will you contract for safety?   NA  Current AVH:  No  Plan for Discharge/Comments:  Patient attending discharge planning group and actively participated in group.  She reports being much better and ready to discharge home today. She will follow up with [facility] and her outpatient providers.  CSW provided all participants with daily workbook and information on services offered by [facility].   Transportation Means: Patient has transportation.   Supports:  Patient has a support system.   Laurelyn Terrero, Joesph July

## 2013-03-15 NOTE — Progress Notes (Signed)
Patient ID: Alexis [name], female   DOB: 1986/06/18, 27 y.o.   MRN: [id number] Patient discharged per physician order; patient denies SI/HI and A/V hallucinations; patient received copy of AVS and prescription after it was reviewed; patient had no other questions or concerns at this time; patient verbalized and signed that she she received all belongings; patient left the unit ambulatory

## 2013-03-15 NOTE — Progress Notes (Signed)
Adult Psychoeducational Group Note  Date:  03/15/2013 Time:  10:00 11:00 Group Topic/Focus:  Therapeutic Activity and Personal Development Group  Participation Level:  Active  Participation Quality:  Appropriate and Attentive  Affect:  Appropriate  Cognitive:  Alert and Appropriate  Insight: Appropriate  Engagement in Group:  Engaged  Modes of Intervention:  Discussion and Education  Additional Comments:  Pt attended and participated in group. When ask what would she do if she had a million dollars pt stated put some up for my kids donate some of it and give some away. Pt was ask what was her trigger and pt stated Racing thoughts and being alone.  Shelly Bombard D 03/15/2013, 1:14 PM

## 2013-03-15 NOTE — Tx Team (Signed)
Patient husband attended treatment team and expressed concerns of patient attempting to hurt herself in the future, harming him and/or the children.  Patient addressed husband concerns and stated she would never harm him or the children and plans to stay of her medications.  MD stressed to patient the importance of staying on medications and the need to have a team approach, MD, therapist and family, before stopping any meds.  She apologized to husband for having been with another man and shared with him that staying on meds and being active, not sitting at home, will be helpful for her.  Patient to discharge home with husband today.

## 2013-03-15 NOTE — Discharge Summary (Signed)
Physician Discharge Summary Note  Patient:  Alexis [name] is an 27 y.o., female MRN:  [id number] DOB:  [birth year] Patient phone:  [phone] (home)  Patient address:   [address] [place] [address],   Date of Admission:  03/09/2013 Date of Discharge: 03/15/2013  Reason for Admission:  Suicide attempt via overdose  Discharge Diagnoses: Active Problems:   Generalized anxiety disorder   Major depressive disorder, recurrent episode, moderate   Suicide attempt   Bipolar disorder  ROS  DSM5:  Schizophrenia Disorders:  Obsessive-Compulsive Disorders:  Trauma-Stressor Disorders:  Substance/Addictive Disorders:  Depressive Disorders: Major Depressive Disorder - Severe (296.23)  AXIS I: Bipolar, Depressed, Obsessive Compulsive Disorder and Substance Abuse  AXIS II: Cluster B Traits  AXIS III:  Past Medical History   Diagnosis  Date   .  No pertinent past medical history    .  Depression    .  OCD (obsessive compulsive disorder)    .  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 She was felt to be in need of acute psychiatric hospitalization and transferred to [facility] for stabilization and crisis management.        Alexis [name] was oriented to the unit and encouraged to participate in unit programming. Medical problems were identified and treated appropriately. Home medication was restarted as needed. Psychiatric medication management was initiated.        The patient was evaluated each day by a clinical provider to ascertain the patient's response to treatment.  Improvement was noted  by the patient's report of decreasing symptoms, improved sleep and appetite, affect, medication tolerance, behavior, and participation in unit programming.  She was asked each day to complete a self inventory noting mood, mental status, pain, new symptoms, anxiety and concerns.         She responded well to medication and being in a therapeutic and supportive environment. However Alexis [name] minimized her symptoms as well as her actions. She showed minimal improvement in insight or judgement during her stay. Alexis [name] also minimized any indication of substance abuse. Her UDS was positive for amphetamines, which she reported were probably her kid's or her husband's. He denied this during a phone conversation, as well as during the family meeting prior to her discharge.          Her husband was supportive of her during her hospitalization and hoped to continue to work towards a solid marriage despite her indiscretions.                  By the day of discharge the patient was in much improved condition than upon admission. Symptoms were reported as significantly decreased or resolved completely. The patient denied SI/HI and voiced no AVH. She was motivated to continue taking medication with a goal of continued improvement in mental health.  She was discharged home with a plan to follow up as noted below.   Consults:  None  Significant Diagnostic Studies:  None  Discharge Vitals:   Blood pressure 106/73, pulse 112, temperature 97.9 F (36.6 C), temperature source Oral, resp. rate 16, height 5' 5.75" (  1.67 m), weight 54.885 kg (121 lb), last menstrual period 02/28/2013, not currently breastfeeding. Body mass index is 19.68 kg/(m^2). Lab Results:   No results found for this or any previous visit (from the past 72 hour(s)).  Physical Findings: AIMS: Facial and Oral Movements Muscles of Facial Expression: None, normal Lips and Perioral Area: None, normal Jaw: None, normal Tongue: None, normal,Extremity  Movements Upper (arms, wrists, hands, fingers): None, normal Lower (legs, knees, ankles, toes): None, normal, Trunk Movements Neck, shoulders, hips: None, normal, Overall Severity Severity of abnormal movements (highest score from questions above): None, normal Incapacitation due to abnormal movements: None, normal Patient's awareness of abnormal movements (rate only patient's report): No Awareness, Dental Status Current problems with teeth and/or dentures?: No Does patient usually wear dentures?: Yes  CIWA:    COWS:     Psychiatric Specialty Exam: See Psychiatric Specialty Exam and Suicide Risk Assessment completed by Attending Physician prior to discharge.  Discharge destination:  Home  Is patient on multiple antipsychotic therapies at discharge:  No   Has Patient had three or more failed trials of antipsychotic monotherapy by history:  No  Recommended Plan for Multiple Antipsychotic Therapies: NA  Discharge Orders   Future Orders Complete By Expires   Diet - low sodium heart healthy  As directed    Discharge instructions  As directed    Comments:     Take all of your medications as directed. Be sure to keep all of your follow up appointments.  If you are unable to keep your follow up appointment, call your Doctor's office to let them know, and reschedule.  Make sure that you have enough medication to last until your appointment. Be sure to get plenty of rest. Going to bed at the same time each night will help. Try to avoid sleeping during the day.  Increase your activity as tolerated. Regular exercise will help you to sleep better and improve your mental health. Eating a heart healthy diet is recommended. Try to avoid salty or fried foods. Be sure to avoid all alcohol and illegal drugs.   Increase activity slowly  As directed        Medication List    STOP taking these medications       clonazePAM 0.5 MG tablet  Commonly known as:  KLONOPIN      TAKE these medications      Indication   ARIPiprazole 10 MG tablet  Commonly known as:  ABILIFY  Take 1 tablet (10 mg total) by mouth 2 (two) times daily. For depression and as adjuvant therapy for mood stabilization.   Indication:  Major Depressive Disorder     hydrOXYzine 50 MG tablet  Commonly known as:  ATARAX/VISTARIL  Take 1 tablet (50 mg total) by mouth 2 (two) times daily. For anxiety.   Indication:  Anxiety Neurosis           Follow-up Information   Follow up with Dr. [facility]ann[facility] On 03/15/2013. (Follow up to be scheduled upon completion of [facility])    Contact information:   [address] [place], [place]  [address]  [phone]      Follow up with [facility] On 03/15/2013. (You are scheduled with Dereck Ligas on Thursday, March 23, 2013 at 2 PM)    Contact information:   [address] [place], [place]   [address]  [phone]      Follow up with [facility]-  [facility] On 03/17/2013. (Friday,  March 17, 2013 at 8:45 AM)    Contact information:   [address] [place], [place]   [address]  [phone]      Follow-up recommendations:   Activities: Resume activity as tolerated. Diet: Heart healthy low sodium diet Tests: Follow up testing will be determined by your out patient provider. Comments:    Total Discharge Time:  Less than 30 minutes.  Signed: MASHBURN,NEIL 03/15/2013, 2:00 PM  Patient is seen and evaluated personally for suicidalrisk assessment and case discussed with physician extender and treatment team and developed discharge plan. Reviewed the information documented and agree with the treatment plan.Darrol Jump R. 03/18/2013 3:27 PM

## 2013-03-15 NOTE — Tx Team (Signed)
Interdisciplinary Treatment Plan Update   Date Reviewed:  03/15/2013  Time Reviewed:  9:47 AM  Progress in Treatment:   Attending groups: Yes Participating in groups: Yes Taking medication as prescribed: Yes, multiple contacts made with husband and he attended treatment team  Tolerating medication: Yes Family/Significant other contact made: Yes  Patient understands diagnosis: Yes  Discussing patient identified problems/goals with staff: Yes Medical problems stabilized or resolved: Yes Denies suicidal/homicidal ideation: Yes Patient has not harmed self or others: Yes  For review of initial/current patient goals, please see plan of care.  Estimated Length of Stay:  Discharge Today  Reasons for Continued Hospitalization:   New Problems/Goals identified:    Discharge Plan or Barriers:   Home with outpatient follow up [facility]  Additional Comments: N/A  Attendees:  Patient: Alexis [name] 03/15/2013 9:47 AM   Signature: Mervyn Gay, MD 03/15/2013 9:47 AM  Signature:  Verne Spurr, PA 03/15/2013 9:47 AM  Signature: Liborio Nixon RN 03/15/2013 9:47 AM  Signature: Nestor Ramp, RN 03/15/2013 9:47 AM  Signature:  [name], Husband 03/15/2013 9:47 AM  Signature:  Juline Patch, LCSW 03/15/2013 9:47 AM  Signature:  Reyes Ivan, LCSW 03/15/2013 9:47 AM  Signature:  Maseta Dorley,Care Coordinator 03/15/2013 9:47 AM  ignature:   03/15/2013 9:47 AM  Signature: Leighton Parody, RN 03/15/2013  9:47 AM  Signature:    Signature:      Scribe for Treatment Team:   Juline Patch,  03/15/2013 9:47 AM

## 2013-03-15 NOTE — [facility] Suicide Risk Assessment (Signed)
Suicide Risk Assessment  Discharge Assessment     Demographic Factors:  Adolescent or young adult and Caucasian  Mental Status Per Nursing Assessment::   On Admission:  Suicidal ideation indicated by patient;Suicide plan;Self-harm thoughts;Intention to act on suicide plan  Current Mental Status by Physician: Patient is calm and cooperative. Patient has a good mood with a bright affect. Patient is in normal speech and thought process. Patient has no suicidal or homicidal ideations, intentions or plans. Patient has no evidence of psychotic symptoms.  Loss Factors: Financial problems/change in socioeconomic status  Historical Factors: Prior suicide attempts and Impulsivity  Risk Reduction Factors:   Responsible for children under 60 years of age  Continued Clinical Symptoms:  Bipolar Disorder:   Mixed State  Cognitive Features That Contribute To Risk:  Polarized thinking    Suicide Risk:  Minimal: No identifiable suicidal ideation.  Patients presenting with no risk factors but with morbid ruminations; may be classified as minimal risk based on the severity of the depressive symptoms  Discharge Diagnoses:   AXIS I:  Bipolar, mixed and Generalized Anxiety Disorder AXIS II:  Deferred AXIS III:   Past Medical History  Diagnosis Date  . No pertinent past medical history   . Depression   . OCD (obsessive compulsive disorder)   . Anxiety    AXIS IV:  other psychosocial or environmental problems and problems related to social environment AXIS V:  61-70 mild symptoms  Plan Of Care/Follow-up recommendations:  Activity:  As tolerated Diet:  Regular  Is patient on multiple antipsychotic therapies at discharge:  No   Has Patient had three or more failed trials of antipsychotic monotherapy by history:  No  Recommended Plan for Multiple Antipsychotic Therapies: NA  Alexis [name]., M.D. 03/15/2013, 12:02 PM

## 2013-03-16 ENCOUNTER — Other Ambulatory Visit (HOSPITAL_COMMUNITY): Payer: 59 | Attending: Family Medicine

## 2013-03-17 ENCOUNTER — Encounter (HOSPITAL_COMMUNITY): Payer: Self-pay

## 2013-03-17 ENCOUNTER — Other Ambulatory Visit (HOSPITAL_COMMUNITY): Payer: 59

## 2013-03-17 ENCOUNTER — Other Ambulatory Visit (HOSPITAL_COMMUNITY): Payer: 59 | Attending: Psychiatry | Admitting: Psychiatry

## 2013-03-17 DIAGNOSIS — F319 Bipolar disorder, unspecified: Secondary | ICD-10-CM

## 2013-03-17 DIAGNOSIS — F3130 Bipolar disorder, current episode depressed, mild or moderate severity, unspecified: Secondary | ICD-10-CM | POA: Insufficient documentation

## 2013-03-17 DIAGNOSIS — F411 Generalized anxiety disorder: Secondary | ICD-10-CM | POA: Insufficient documentation

## 2013-03-17 NOTE — Progress Notes (Signed)
    Daily Group Progress Note  Program: IOP  Group Time: 9:00-10:30 am   Participation Level: Active  Behavioral Response: Appropriate  Type of Therapy:  Process Group  Summary of Progress: Today was Pts first day in the group. She was completing the intiial orientation process, was introduced and observed the group process.      Group Time: 10:30 am - 12:00 pm   Participation Level:  Active  Behavioral Response: Appropriate  Type of Therapy: Psycho-education Group  Summary of Progress:  Pt practiced using the breathing technique - heartmath - to manage anxiety and stress.   Carman Ching, LCSW

## 2013-03-17 NOTE — Progress Notes (Signed)
Patient ID: Alexis [name], female   DOB: 1986/04/30, 27 y.o.   MRN: [id number] D:  This is a 27 yr old, married, caucasian female, who was transitioned from the inpatient unit.  Pt was admitted at [facility] for seven days post suicide attempt (#25 Klonopin).  Pt currently denies any SI, HI or A/V hallucinations.  States she is in MH-IOP due to depression, anxiety (OCD), bipolar and racing thoughts.  Pt has one prior admit at [facility] due to SI.  Reports that her half brother committed suicide by way of gunshot in 2008.  He was Bipolar.  Pt has been seeing Dr. Jannifer Franklin  ~ one year for medication management.  Also has been seeing Candace Gallus, [facility] since March 2014.  Pt c/o struggling with  anxiety and depressive symptoms for a year, but symptoms worsening a couple of months ago.  Stressors:  1)  Marriage of two years.  They have three girls (ages:  1, 2, 6).  Pt states she is alone with the girls ~ three days a week because she and husband can't afford to pay for daycare five days a week.  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  "I like my job a lot.  It's my haven."  States she works three nights a week.  "The kids are at the age that they are fighting and picking on eachother."   Childhood:  "Good"  Denies any trauma or abuse.  Parents divorced when patient was age 80.  Pt resided with both off and on. Siblings:  One half sister and two half brothers. Denies any drugs/ETOH. Legal:  States a 50-B was taken out by her husband whenever she overdosed, but it has been lifted. Reports that her parents are her support system. Pt completed all forms.  Scored 5 on the burns.  Pt will attend MH-IOP for ten days.  A:  Oriented pt.  Provided pt with an orientation folder.  Informed Dr. Jannifer Franklin and Candace Gallus, [facility] of admit.  Encouraged support groups.  R:  Pt receptive.

## 2013-03-20 ENCOUNTER — Encounter (HOSPITAL_COMMUNITY): Payer: Self-pay | Admitting: Psychiatry

## 2013-03-20 ENCOUNTER — Other Ambulatory Visit (HOSPITAL_COMMUNITY): Payer: 59 | Admitting: Psychiatry

## 2013-03-20 ENCOUNTER — Other Ambulatory Visit (HOSPITAL_COMMUNITY): Payer: 59

## 2013-03-20 DIAGNOSIS — F319 Bipolar disorder, unspecified: Secondary | ICD-10-CM

## 2013-03-20 MED ORDER — LAMOTRIGINE 25 MG PO TABS: ORAL_TABLET | ORAL | Status: DC

## 2013-03-20 NOTE — Progress Notes (Signed)
    Daily Group Progress Note  Program: IOP  Group Time: 9:00-10:30 am   Participation Level: Active  Behavioral Response: Appropriate  Type of Therapy:  Process Group  Summary of Progress: Pt shared for the first time since joining the group. She said she has been depressed since having her second and third child and has not felt happy since before she had children. Pt said she becomes easily angry with her husband and can get aggressive with him and wants to learn ways to manage her feelings more effectively. She received support by others who experienced her as feeling overwhelmed.      Group Time: 10:30 am - 12:00 pm   Participation Level:  Active  Behavioral Response: Appropriate  Type of Therapy: Psycho-education Group  Summary of Progress: Pt participated in a group with a focus on grief and loss and identified current losses impacted overall wellness and effective grieving strategies.   Carman Ching, LCSW

## 2013-03-20 NOTE — Progress Notes (Signed)
Psychiatric Assessment Adult  Patient Identification:  Alexis [name] Date of Evaluation:  03/20/2013 Chief Complaint: Need for continued intensive outpatient group therapy for bipolar depression following an inpatient admission due to a suicide attempt by way of overdose. History of Chief Complaint:   Chief Complaint  Patient presents with  . Anxiety  . Depression    HPI Alexis [name] is a 27 year old married, employed, white female who is referred to the intensive outpatient program after an admission to the adult unit at [facility]  for treatment of depression and having made a suicide attempt to overdose. She reports that she has been depressed for the past 2 years after the birth of her last 2 children, but her depression has increased significantly over the past year. She had been admitted to that same facility in March of 2014, and discharged with the diagnosis of bipolar disorder. She endorses symptoms of depression including recent sadness, feelings of worthlessness and hopelessness, poor energy, lack of motivation and decreased interest in participation, a desire to isolate, increased sleep, and decreased appetite. She also endorses symptoms of anxiety including excessive worry and daily panic attacks, as well as nervousness in social situations. She reports that she obsessively cleans her home, but denies any history of traumatic exposure. She also endorses symptoms of bipolar disorder including periods of decreased need for sleep and increased mood and energy that last for 2-3 days. She also endorses some impulsive behaviors including stenting extravagantly and inappropriate sexual behavior. She also endorses some racing thoughts, as well as anxious paranoia. She denies any auditory or visual hallucinations. She denies experiencing homicidal ideation. She has had past suicidal thoughts with plan to overdose, but denies any current suicidal thoughts. Brittney feels that the  Abilify is making her feel restless and fidgety.  Review of Systems  Constitutional: Negative.   HENT: Negative.   Eyes: Negative.   Respiratory: Negative.   Cardiovascular: Negative.   Gastrointestinal: Negative.   Endocrine: Negative.   Genitourinary: Negative.   Musculoskeletal: Negative.   Allergic/Immunologic: Negative.   Neurological: Negative.   Hematological: Negative.   Psychiatric/Behavioral: Positive for dysphoric mood. The patient is nervous/anxious.    Physical Exam  Constitutional: She is oriented to person, place, and time. She appears well-developed and well-nourished.  HENT:  Head: Normocephalic and atraumatic.  Eyes: Conjunctivae are normal. Pupils are equal, round, and reactive to light.  Neck: Normal range of motion.  Musculoskeletal: Normal range of motion.  Neurological: She is alert and oriented to person, place, and time.    Depressive Symptoms: depressed mood, hypersomnia, feelings of worthlessness/guilt, hopelessness, suicidal thoughts with specific plan, suicidal attempt, anxiety, panic attacks, loss of energy/fatigue, decreased appetite,  (Hypo) Manic Symptoms:   Elevated Mood:  Yes Irritable Mood:  No Grandiosity:  No Distractibility:  No Labiality of Mood:  Yes Delusions:  No Hallucinations:  No Impulsivity:  Yes Sexually Inappropriate Behavior:  Yes Financial Extravagance:  Yes Flight of Ideas:  No  Anxiety Symptoms: Excessive Worry:  Yes Panic Symptoms:  Yes Agoraphobia:  No Obsessive Compulsive: Yes  Symptoms: Cleaning Specific Phobias:  No Social Anxiety:  Yes  Psychotic Symptoms:  Hallucinations: No None Delusions:  No Paranoia:  No   Ideas of Reference:  No  PTSD Symptoms: Ever had a traumatic exposure:  No Had a traumatic exposure in the last month:  No Re-experiencing: No None Hypervigilance:  No Hyperarousal: No None Avoidance: Yes Decreased Interest/Participation  Traumatic Brain Injury: No   Past  Psychiatric  History: Diagnosis:  Bipolar disorder  Hospitalizations:  Twice at [facility] - March 2014 and September 2014   Outpatient Care:  Dr. Jannifer Franklin; Ovidio Kin therapist at [facility]   Substance Abuse Care: Denies   Self-Mutilation: Denies   Suicidal Attempts: 1 - overdose September 2014   Violent Behaviors: Denies   Past Medical History:   Past Medical History  Diagnosis Date  . No pertinent past medical history   . Depression   . OCD (obsessive compulsive disorder)   . Anxiety   . Bipolar disorder    History of Loss of Consciousness:  No Seizure History:  No Cardiac History:  No Allergies:  No Known Allergies Current Medications:  Current Outpatient Prescriptions  Medication Sig Dispense Refill  . ARIPiprazole (ABILIFY) 10 MG tablet Take 1 tablet (10 mg total) by mouth 2 (two) times daily. For depression and as adjuvant therapy for mood stabilization.  60 tablet  0  . hydrOXYzine (ATARAX/VISTARIL) 50 MG tablet Take 1 tablet (50 mg total) by mouth 2 (two) times daily. For anxiety.  60 tablet  0  . lamoTRIgine (LAMICTAL) 25 MG tablet Take 1 tablet daily for 2 weeks, then take 2 tablets daily for 2 weeks, then take 4 tablets daily.  42 tablet  0   No current facility-administered medications for this visit.    Previous Psychotropic Medications:  Medication Dose   Abilify  10 mg twice daily    Vistaril   50 mg twice daily   Klonopin    Celexa    Risperdal    Zoloft    Depakote     Substance Abuse History in the last 12 months: Patient has a history of substance abuse  Social History: Alexis [name] was born and grew up in [place], [place]. She has a half-sister and 3 half-brothers, one of whom is deceased. She reports she had a good childhood. 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 She has been married for 2 years, and has 3 children. She denies any low problems. She does not have  any hobbies. 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 She reports her social support system consists of her mother, father, brother, and husband.  Family History:   Family History  Problem Relation Age of Onset  . Bipolar disorder Brother     Mental Status Examination/Evaluation: Objective:  Appearance: Casual  Eye Contact::  Good  Speech:  Clear and Coherent  Volume:  Normal  Mood:   dysphoric and mildly anxious   Affect:  Congruent  Thought Process:  Linear  Orientation:  Full (Time, Place, and Person)  Thought Content:  WDL  Suicidal Thoughts:  No  Homicidal Thoughts:  No  Judgement:  Fair  Insight:  Fair  Psychomotor Activity:  Normal  Akathisia:  No  Handed:  Right  AIMS (if indicated):    Assets:  Communication Skills Desire for Improvement Financial Resources/Insurance Housing Intimacy Physical Health Social Support Talents/Skills Transportation Vocational/Educational    Laboratory/X-Ray Psychological Evaluation(s)        Assessment:    AXIS I Bipolar, Depressed and Generalized Anxiety Disorder  AXIS II Deferred  AXIS III Past Medical History  Diagnosis Date  . No pertinent past medical history   . Depression   . OCD (obsessive compulsive disorder)   . Anxiety   . Bipolar disorder      AXIS IV economic problems and problems related to social environment  AXIS V 51-60 moderate symptoms   Treatment Plan/Recommendations:  Plan of Care:  admit to IOP where she will attend group therapy sessions 5 days weekly for 3 hours each session. Initiate Lamictal and decrease Abilify. Continue Vistaril   Laboratory:    Psychotherapy:  attend groups   Medications:  Abilify 10 mg daily, Lamictal 25 mg daily for 2 weeks then 50 mg daily for 2 weeks then 100 mg daily, Vistaril 50 mg twice daily.   Routine PRN Medications:  No  Consultations:  none   Safety Concerns:   history of suicide attempt   Other:     Yolande Jolly, MHS, PA-C  Carman Ching, [place] 9/22/20141:23  PM

## 2013-03-20 NOTE — Progress Notes (Signed)
Patient Discharge Instructions:  After Visit Summary (AVS):   Faxed to:  03/20/13 Discharge Summary Note:   Faxed to:  03/20/13 Psychiatric Admission Assessment Note:   Faxed to:  03/20/13 Suicide Risk Assessment - Discharge Assessment:   Faxed to:  03/20/13 Faxed/Sent to the Next Level Care provider:  03/20/13 Next Level Care Provider Has Access to the EMR, 03/20/13 Faxed to [facility] @ [phone] Faxed to [facility] @ [phone] Records provided to [facility] via [facility]/Epic access.  Jerelene Redden, 03/20/2013, 1:11 PM

## 2013-03-21 ENCOUNTER — Other Ambulatory Visit (HOSPITAL_COMMUNITY): Payer: 59

## 2013-03-21 ENCOUNTER — Other Ambulatory Visit (HOSPITAL_COMMUNITY): Payer: 59 | Admitting: Psychiatry

## 2013-03-21 DIAGNOSIS — F411 Generalized anxiety disorder: Secondary | ICD-10-CM

## 2013-03-21 DIAGNOSIS — F319 Bipolar disorder, unspecified: Secondary | ICD-10-CM

## 2013-03-21 NOTE — Progress Notes (Signed)
    Daily Group Progress Note  Program: IOP  Group Time: 9:00-10:30 am   Participation Level: Active  Behavioral Response: Appropriate  Type of Therapy:  Process Group  Summary of Progress: Pt is more open with the group and talkative. She said she struggles with the judgement that her family has towards her and their high expectations that are hard to meet. Pt expressed high anxiety today associated with an argument her and her husband had last night and Pt expressed a fear of her husband taking their three children away from her if they separated. Pt said she needs time alone with her husband to try to reconnect the marriage and she agreed to give thought to how she could make that happen.      Group Time: 10:30 am - 12:00 pm   Participation Level:  Active  Behavioral Response: Appropriate  Type of Therapy: Psycho-education Group  Summary of Progress: Pt explored unhealthy ways of coping with difficult emotions that create further chaos to identify how they are unhealthy as a lead into learning healthy coping strategies.   Carman Ching, LCSW

## 2013-03-22 ENCOUNTER — Other Ambulatory Visit (HOSPITAL_COMMUNITY): Payer: 59 | Admitting: Psychiatry

## 2013-03-22 ENCOUNTER — Other Ambulatory Visit (HOSPITAL_COMMUNITY): Payer: 59

## 2013-03-22 DIAGNOSIS — F319 Bipolar disorder, unspecified: Secondary | ICD-10-CM

## 2013-03-22 DIAGNOSIS — F411 Generalized anxiety disorder: Secondary | ICD-10-CM

## 2013-03-22 NOTE — Progress Notes (Signed)
    Daily Group Progress Note  Program: IOP  Group Time: 9:00-10:30 am   Participation Level: Active  Behavioral Response: Appropriate  Type of Therapy:  Process Group  Summary of Progress: Pt reports feeling "good" today and had elevated affect. Pt said she is trying to work on improving her relationship with her husband and scheduled a date night with him for this Saturday. She expressed fears about the future of the relationship due to his lack of support for her, but appears open minded to try and work on the relationship. Pt also processed her tendency to become very angry and physically violent with her husband and expressed a desire to learn how to better manage her emotions.      Group Time: 10:30 am - 12:00 pm   Participation Level:  Active  Behavioral Response: Appropriate  Type of Therapy: Psycho-education Group  Summary of Progress: Pt learned the DBT skill of Distress Tolerance and how to use ACCEPTS to manage difficult feelings.   Carman Ching, LCSW

## 2013-03-23 ENCOUNTER — Other Ambulatory Visit (HOSPITAL_COMMUNITY): Payer: 59

## 2013-03-23 ENCOUNTER — Telehealth (HOSPITAL_COMMUNITY): Payer: Self-pay | Admitting: Psychiatry

## 2013-03-24 ENCOUNTER — Other Ambulatory Visit (HOSPITAL_COMMUNITY): Payer: 59 | Admitting: Psychiatry

## 2013-03-24 ENCOUNTER — Other Ambulatory Visit (HOSPITAL_COMMUNITY): Payer: 59

## 2013-03-24 DIAGNOSIS — F411 Generalized anxiety disorder: Secondary | ICD-10-CM

## 2013-03-24 DIAGNOSIS — F319 Bipolar disorder, unspecified: Secondary | ICD-10-CM

## 2013-03-24 NOTE — Progress Notes (Signed)
    Daily Group Progress Note  Program: IOP  Group Time: 9:00-10:30 am   Participation Level: Active  Behavioral Response: Appropriate  Type of Therapy:  Process Group  Summary of Progress: Pt reports mild depression symptoms today but said she missed group yesterday due to having a conflict with her husband that escelated and interfered with her attending group. Pt said her husband lacks trust in Pt and thought she was having an affair yesterday instead of getting ready to go to group. Pt said her and her husband have considered divorce, but agreed last night to try marriage counseling. Pt appeared relieved to try to salvage the marriage. Pt talks about how she feels she has "lost her own identity" and "does not know who she is anymore". Pt said she does not do much for herself and lacks fun in her life with working and taking care of three children.      Group Time: 10:30 am - 12:00 pm   Participation Level:  Active  Behavioral Response: Appropriate  Type of Therapy: Psycho-education Group  Summary of Progress: Pt learned about the DBT skill of Distress Tolerance and how to use the skills to manage difficult feelings and situations.   Carman Ching, LCSW

## 2013-03-27 ENCOUNTER — Other Ambulatory Visit (HOSPITAL_COMMUNITY): Payer: 59 | Admitting: Psychiatry

## 2013-03-27 ENCOUNTER — Other Ambulatory Visit (HOSPITAL_COMMUNITY): Payer: 59

## 2013-03-27 DIAGNOSIS — F411 Generalized anxiety disorder: Secondary | ICD-10-CM

## 2013-03-27 DIAGNOSIS — F319 Bipolar disorder, unspecified: Secondary | ICD-10-CM

## 2013-03-27 NOTE — Progress Notes (Signed)
    Daily Group Progress Note  Program: IOP  Group Time: 9:00-10:30 am   Participation Level: Active  Behavioral Response: Appropriate  Type of Therapy:  Process Group  Summary of Progress: Pt said her mood is elevated and her stress has reduced. Pt said her marriage has improved as she has put in more effort into repairing it but Pt still is unsure of who she is apart from her roles as wife and mother. Pt is exploring a need to put her children in daycare one additional day to allow her some time to herself to recharge.      Group Time: 10:30 am - 12:00 pm   Participation Level:  None  Behavioral Response: none  Type of Therapy: none  Summary of Progress: group ended early today.   Carman Ching, LCSW

## 2013-03-28 ENCOUNTER — Other Ambulatory Visit (HOSPITAL_COMMUNITY): Payer: 59

## 2013-03-28 ENCOUNTER — Telehealth (HOSPITAL_COMMUNITY): Payer: Self-pay | Admitting: Psychiatry

## 2013-03-29 ENCOUNTER — Other Ambulatory Visit (HOSPITAL_COMMUNITY): Payer: 59

## 2013-03-29 ENCOUNTER — Other Ambulatory Visit (HOSPITAL_COMMUNITY): Payer: 59 | Attending: Psychiatry

## 2013-03-29 DIAGNOSIS — F411 Generalized anxiety disorder: Secondary | ICD-10-CM | POA: Insufficient documentation

## 2013-03-29 DIAGNOSIS — F3130 Bipolar disorder, current episode depressed, mild or moderate severity, unspecified: Secondary | ICD-10-CM | POA: Insufficient documentation

## 2013-03-30 ENCOUNTER — Other Ambulatory Visit (HOSPITAL_COMMUNITY): Payer: 59 | Admitting: Psychiatry

## 2013-03-30 ENCOUNTER — Other Ambulatory Visit (HOSPITAL_COMMUNITY): Payer: 59

## 2013-03-30 DIAGNOSIS — F319 Bipolar disorder, unspecified: Secondary | ICD-10-CM

## 2013-03-30 DIAGNOSIS — F411 Generalized anxiety disorder: Secondary | ICD-10-CM

## 2013-03-30 NOTE — Progress Notes (Signed)
    Daily Group Progress Note  Program: IOP  Group Time: 9:00-10:30 am   Participation Level: Active  Behavioral Response: Appropriate  Type of Therapy:  Process Group  Summary of Progress: Pt reports elevated mood, but continues to express frustration with her husbands lack of support for Pts depression symptoms. Pt said her husband is rejecting her idea of putting the girls in daycare one additional day per week to allow her self care time. Pt said she knows something needs to happen with her schedule or she will continue to feel overwhelmed.      Group Time: 10:30 am - 12:00 pm   Participation Level:  Active  Behavioral Response: Appropriate  Type of Therapy: Psycho-education Group  Summary of Progress: pt learned about the ways to set healthy boundaries with others to ensure wellness and practiced using personal life examples.   Carman Ching, LCSW

## 2013-03-31 ENCOUNTER — Other Ambulatory Visit (HOSPITAL_COMMUNITY): Payer: 59

## 2013-03-31 ENCOUNTER — Other Ambulatory Visit (HOSPITAL_COMMUNITY): Payer: 59 | Admitting: Psychiatry

## 2013-03-31 DIAGNOSIS — F319 Bipolar disorder, unspecified: Secondary | ICD-10-CM

## 2013-03-31 NOTE — Progress Notes (Signed)
Patient ID: Alexis [name], female   DOB: 04-07-1986, 27 y.o.   MRN: [id number]  Discharge Note  Patient: [name] DOB: April 17, 1986  Date of Admission: 03/17/2013 Date of Discharge: 03/31/2013  Reason for Admission: Referred to the intensive outpatient program after an admission to the adult unit at [facility] for treatment of depression and having made a suicide attempt to overdose.   Hospital course [name] was admitted to the intensive outpatient program. She was oriented to the facility and the program schedule. Her Abilify was reduced to 10 mg daily as she felt 20 mg was causing her to feel restless and fidgety. She was continued on her Vistaril 50 mg twice daily, and started on Lamictal 25 mg daily with a plan to titrate up to 100 mg daily over a 5 week period. She simulated quickly to the group, and was able to openly express her emotions, and talk about her life difficulties. Her mood improved, and her affect reflected that. She missed one day because of a conflict with her husband, and when she returned her mood had decompensated some. With support from the group, she was able to recover her positive attitude. After discharge visit she reported that she was looking forward to going back to work. She felt that the group therapy experience was very beneficial, and especially enjoyed having the support of positive people in her life. She found that being able to talk was cathartic. She denies any suicidal or homicidal ideation. She denies any auditory or visual hallucinations.   Mental status at discharge well-nourished and well developed, alert and oriented, good eye contact, well groomed and nicely dressed, euthymic mood with congruent affect, speech is clear and coherent had a regular rate and rhythm and normal volume, cognitive functioning is within normal limits, memory and concentration are intact, no outward signs of psychosis or suicidality.  Lab Results: No  results found for this or any previous visit (from the past 48 hour(s)).   Discharge prescriptions: No prescriptions were given at discharge, but her discharge medications are as follows: Abilify 10 mg daily   Vistaril 50 mg twice daily   Lamictal 50 mg daily for 2 weeks then increase to 100 mg daily  Axis Diagnosis:  Axis I: Bipolar disorder, currently stable; generalized anxiety disorder  Axis II: Deferred  Axis III: No pertinent past medical history  Axis IV: Mild to moderate  Axis V: 65  Level of Care: OP  Discharge destination: Home  Is patient on multiple antipsychotic therapies at discharge: No  Has Patient had three or more failed trials of antipsychotic monotherapy by history: No  Follow-up recommendations: Activity: As tolerated  Diet: Regular   Follow-up appointments: Patient will followup with her outpatient psychiatrist Dr. Jannifer Franklin, and therapist Candace Gallus, [facility]  Comments:    The patient received suicide prevention pamphlet: Yes  [place] Spanish. Ferne Elling[place], MHS, PA-C

## 2013-03-31 NOTE — Progress Notes (Signed)
    Daily Group Progress Note  Program: IOP  Group Time: 9:00-10:30 am   Participation Level: Active  Behavioral Response: Appropriate  Type of Therapy:  Process Group  Summary of Progress: Today was pts final day in the group. Pt reports improved mood and reduced depression symptoms. Pt said she still has trust issues and stress in her Donnald Garre, but has a plan for how to get marital support through counseling and support groups. Pt also said she decided to add one  More day of daycare per week for her children to allow her more time to recharge herself each week. Pt reports feeling ready to end the group and move onto individual therapy.      Group Time: 10:30 am - 12:00 pm    Participation Level:  Active  Behavioral Response: Appropriate  Type of Therapy: Psycho-education Group  Summary of Progress: Pt learned about how to use a communication technique to enhance listening and communication.   [facility]

## 2013-03-31 NOTE — Patient Instructions (Signed)
Pt completed MH-IOP today.  Will follow up with Dr. Jannifer Franklin on 04-14-13 @ 1:20pm.  Pt will schedule a follow up appt with Candace Gallus, LPC.  Will return to work on 04-03-13, without any restrictions.  Encouraged support groups.

## 2013-03-31 NOTE — Progress Notes (Signed)
Patient ID: Alexis [name], female   DOB: 1985-07-20, 27 y.o.   MRN: [id number] D: This is a 27 yr old, married, caucasian female, who was transitioned from the inpatient unit. Pt was admitted at [facility] for seven days post suicide attempt (#25 Klonopin). Pt denies any SI, HI or A/V hallucinations.  Stressors: 1) Marriage of two years. They have three girls (ages: 1, 2, 6). Pt states she is alone with the girls ~ three days a week because she and husband can't afford to pay for daycare five days a week. Husband works during the day and patient works Occupational [employer]: [employer], [employer] for four years) third shift. "I like my job a lot. It's my haven." States she works three nights a week. "The kids are at the age that they are fighting and picking on eachother."  Pt completed all scheduled days.  Requesting to return to work on 04-03-13.  States she feels better.  "The groups helped because I didn't feel alone."  Pt continues to struggle with poor sleep.  A:  D/C today.  F/U with Dr. Jannifer Franklin on 04-14-13.  Pt will schedule an appointment with Candace Gallus, LPC.  Encouraged support groups.  RTW on 04-03-13, without any restrictions.  R:  Pt receptive.

## 2013-04-03 ENCOUNTER — Other Ambulatory Visit (HOSPITAL_COMMUNITY): Payer: 59

## 2013-04-04 ENCOUNTER — Other Ambulatory Visit (HOSPITAL_COMMUNITY): Payer: 59

## 2013-04-05 ENCOUNTER — Other Ambulatory Visit (HOSPITAL_COMMUNITY): Payer: 59

## 2013-04-06 ENCOUNTER — Other Ambulatory Visit (HOSPITAL_COMMUNITY): Payer: 59

## 2013-04-07 ENCOUNTER — Other Ambulatory Visit (HOSPITAL_COMMUNITY): Payer: 59

## 2013-04-10 ENCOUNTER — Other Ambulatory Visit (HOSPITAL_COMMUNITY): Payer: 59

## 2013-04-11 ENCOUNTER — Other Ambulatory Visit (HOSPITAL_COMMUNITY): Payer: 59

## 2013-04-12 ENCOUNTER — Other Ambulatory Visit (HOSPITAL_COMMUNITY): Payer: 59

## 2013-04-13 ENCOUNTER — Other Ambulatory Visit (HOSPITAL_COMMUNITY): Payer: 59

## 2013-04-14 ENCOUNTER — Other Ambulatory Visit (HOSPITAL_COMMUNITY): Payer: 59

## 2013-04-17 ENCOUNTER — Other Ambulatory Visit (HOSPITAL_COMMUNITY): Payer: 59

## 2013-04-18 ENCOUNTER — Other Ambulatory Visit (HOSPITAL_COMMUNITY): Payer: 59

## 2013-04-19 ENCOUNTER — Other Ambulatory Visit (HOSPITAL_COMMUNITY): Payer: 59

## 2013-04-20 ENCOUNTER — Other Ambulatory Visit (HOSPITAL_COMMUNITY): Payer: 59

## 2013-04-21 ENCOUNTER — Other Ambulatory Visit (HOSPITAL_COMMUNITY): Payer: 59

## 2013-04-24 ENCOUNTER — Other Ambulatory Visit (HOSPITAL_COMMUNITY): Payer: 59

## 2013-05-04 ENCOUNTER — Other Ambulatory Visit: Payer: Self-pay

## 2013-08-03 ENCOUNTER — Ambulatory Visit (HOSPITAL_COMMUNITY)
Admission: RE | Admit: 2013-08-03 | Discharge: 2013-08-03 | Disposition: A | Discharge status: Home / Self Care | Payer: 59 | Source: Ambulatory Visit | Attending: Family Medicine | Admitting: Family Medicine

## 2013-08-03 ENCOUNTER — Other Ambulatory Visit (HOSPITAL_COMMUNITY): Payer: Self-pay | Admitting: Family Medicine

## 2013-08-03 DIAGNOSIS — K37 Unspecified appendicitis: Secondary | ICD-10-CM

## 2013-08-03 DIAGNOSIS — R9389 Abnormal findings on diagnostic imaging of other specified body structures: Secondary | ICD-10-CM | POA: Insufficient documentation

## 2013-08-03 DIAGNOSIS — R109 Unspecified abdominal pain: Secondary | ICD-10-CM | POA: Insufficient documentation

## 2013-08-03 LAB — COMPREHENSIVE METABOLIC PANEL
ALBUMIN: 4.4 g/dL (ref 3.5–5.2)
ALT: 10 U/L (ref 0–35)
AST: 16 U/L (ref 0–37)
Alkaline Phosphatase: 63 U/L (ref 39–117)
BILIRUBIN TOTAL: 0.3 mg/dL (ref 0.3–1.2)
BUN: 6 mg/dL (ref 6–23)
CHLORIDE: 101 meq/L (ref 96–112)
CO2: 27 mEq/L (ref 19–32)
CREATININE: 0.71 mg/dL (ref 0.50–1.10)
Calcium: 9.3 mg/dL (ref 8.4–10.5)
GFR calc Af Amer: >90 mL/min (ref >90)
GFR calc non Af Amer: >90 mL/min (ref >90)
Glucose, Bld: 88 mg/dL (ref 70–99)
Potassium: 3.6 mEq/L — ABNORMAL LOW (ref 3.7–5.3)
Sodium: 141 mEq/L (ref 137–147)
TOTAL PROTEIN: 8.2 g/dL (ref 6.0–8.3)

## 2013-08-03 LAB — CBC WITH DIFFERENTIAL/PLATELET
Basophils Absolute: 0 10*3/uL (ref 0.0–0.1)
Basophils Relative: 0 % (ref 0–1)
EOS ABS: 0.1 10*3/uL (ref 0.0–0.7)
EOS PCT: 2 % (ref 0–5)
HEMATOCRIT: 40.6 % (ref 36.0–46.0)
HEMOGLOBIN: 13.5 g/dL (ref 12.0–15.0)
LYMPHS ABS: 2.5 10*3/uL (ref 0.7–4.0)
Lymphocytes Relative: 47 % — ABNORMAL HIGH (ref 12–46)
MCH: 28.8 pg (ref 26.0–34.0)
MCHC: 33.3 g/dL (ref 30.0–36.0)
MCV: 86.8 fL (ref 78.0–100.0)
MONO ABS: 0.3 10*3/uL (ref 0.1–1.0)
MONOS PCT: 6 % (ref 3–12)
Neutro Abs: 2.5 10*3/uL (ref 1.7–7.7)
Neutrophils Relative %: 45 % (ref 43–77)
PLATELETS: 185 10*3/uL (ref 150–400)
RBC: 4.68 MIL/uL (ref 3.87–5.11)
RDW: 13.8 % (ref 11.5–15.5)
WBC: 5.4 10*3/uL (ref 4.0–10.5)

## 2013-08-03 MED ORDER — IOHEXOL 300 MG/ML  SOLN
100.0000 mL | Freq: Once | INTRAMUSCULAR | Status: AC | PRN
  Administered 2013-08-03: 100 mL via INTRAVENOUS

## 2014-04-30 ENCOUNTER — Encounter (HOSPITAL_COMMUNITY): Payer: Self-pay | Admitting: Psychiatry

## 2014-05-07 ENCOUNTER — Ambulatory Visit (INDEPENDENT_AMBULATORY_CARE_PROVIDER_SITE_OTHER): Payer: 59 | Admitting: Women's Health

## 2014-05-07 ENCOUNTER — Encounter: Payer: Self-pay | Admitting: Women's Health

## 2014-05-07 VITALS — BP 132/78 | Ht 66.0 in | Wt 117.0 lb

## 2014-05-07 DIAGNOSIS — IMO0002 Reserved for concepts with insufficient information to code with codable children: Secondary | ICD-10-CM

## 2014-05-07 DIAGNOSIS — R102 Pelvic and perineal pain: Secondary | ICD-10-CM

## 2014-05-07 DIAGNOSIS — N898 Other specified noninflammatory disorders of vagina: Secondary | ICD-10-CM

## 2014-05-07 DIAGNOSIS — N941 Dyspareunia: Secondary | ICD-10-CM

## 2014-05-07 LAB — POCT WET PREP (WET MOUNT): Clue Cells Wet Prep Whiff POC: NEGATIVE

## 2014-05-07 NOTE — Progress Notes (Signed)
Patient ID: Alexis SchoolBrittany M Caine, female   DOB: 12/27/1985, 28 y.o.   MRN: [id number]   [facility]  Patient name: Alexis [name] MRN [id number]  Date of birth: 12/27/1985  CC & HPI:  Alexis [name] is a 28 y.o. Caucasian female presenting today with report of pressure w/ urination, nonodorous vag d/c, pain during and after sex w/ occ vb after sex x few weeks. Denies itching/irritation. Contraception: BTL, married, doesn't use condoms. Went to pcp thought she may have uti, but urine was neg.  Last pap 2461yrs ago, thinks it was normal, knows she has h/o HPV. Remembers having ovarian cysts at one time. Declines full STI screening.   Pertinent History Reviewed:  Medical & Surgical Hx:   Past Medical History  Diagnosis Date  . No pertinent past medical history   . Depression   . OCD (obsessive compulsive disorder)   . Anxiety   . Bipolar disorder    Past Surgical History  Procedure Laterality Date  . Tubal ligation  02/18/2012    Procedure: POST PARTUM TUBAL LIGATION;  Surgeon: Allie BossierMyra C Dove, MD;  Location: [facility];  Service: Gynecology;  Laterality: Bilateral;   Medications: Reviewed & Updated - see associated section Social History: Reviewed -  reports that she has quit smoking. Her smoking use included Cigarettes. She has a 1 pack-year smoking history. She uses smokeless tobacco.  Objective Findings:  Vitals: BP 132/78 mmHg  Ht 5\' 6"  (1.676 m)  Wt 117 lb (53.071 kg)  BMI 18.89 kg/m2  LMP 04/09/2014  Physical Examination: General appearance - alert, well appearing, and in no distress Pelvic - normal external genitalia, cx clean, small amount thin white nonodorous d/c. Mild CMT, mild adnexal and uterine tenderness w/ palpation. No masses noted.   Results for orders placed or performed in visit on 05/07/14 (from the past 24 hour(s))  POCT Wet Prep Mellody Drown(Wet [place])   Collection Time: 05/07/14  2:25 PM  Result Value Ref Range   Source Wet Prep POC vaginal    WBC, Wet  Prep HPF POC mod    Bacteria Wet Prep HPF POC none    BACTERIA WET PREP MORPHOLOGY POC     Clue Cells Wet Prep HPF POC None    Clue Cells Wet Prep Whiff POC Negative Whiff    Yeast Wet Prep HPF POC None    KOH Wet Prep POC     Trichomonas Wet Prep HPF POC none     Urine dipstick: neg Assessment & Plan:  A:   Pressure w/ urination  Pelvic pain  Dyspareunia P:  Will send GC/CT from urine, if neg will tx for cervicitis  F/U 1wk for pap & physical, possible pelvic u/s if pain/sx continue  Call if sx worsen  Marge DuncansBooker, Jesi Jurgens Randall CNM, [facility] 05/07/2014 2:26 PM

## 2014-05-08 ENCOUNTER — Telehealth: Payer: Self-pay | Admitting: Women's Health

## 2014-05-08 LAB — GC/CHLAMYDIA PROBE AMP
CT Probe RNA: NEGATIVE
GC Probe RNA: NEGATIVE

## 2014-05-08 MED ORDER — METRONIDAZOLE 0.75 % VA GEL: 1.0000 | Freq: Every day | VAGINAL | Status: DC

## 2014-05-08 NOTE — Telephone Encounter (Signed)
Notified pt of neg gc/ct, will treat for cervicitis. Rx metrogel q hs x 5 nights. No etoh or sex until finished. Call if not improving/worsening.  Cheral MarkerKimberly R. Pasqual Farias, CNM, [facility]NP-BC 05/08/2014 9:33 AM

## 2014-05-16 ENCOUNTER — Other Ambulatory Visit: Payer: 59 | Admitting: Women's Health

## 2014-05-30 ENCOUNTER — Ambulatory Visit (INDEPENDENT_AMBULATORY_CARE_PROVIDER_SITE_OTHER): Payer: 59 | Admitting: Women's Health

## 2014-05-30 ENCOUNTER — Other Ambulatory Visit (HOSPITAL_COMMUNITY)
Admission: RE | Admit: 2014-05-30 | Discharge: 2014-05-30 | Disposition: A | Discharge status: Home / Self Care | Payer: 59 | Source: Ambulatory Visit | Attending: Obstetrics & Gynecology | Admitting: Obstetrics & Gynecology

## 2014-05-30 ENCOUNTER — Encounter: Payer: Self-pay | Admitting: Women's Health

## 2014-05-30 VITALS — BP 108/60 | Ht 65.5 in | Wt 117.0 lb

## 2014-05-30 DIAGNOSIS — Z01419 Encounter for gynecological examination (general) (routine) without abnormal findings: Secondary | ICD-10-CM | POA: Diagnosis present

## 2014-05-30 DIAGNOSIS — N946 Dysmenorrhea, unspecified: Secondary | ICD-10-CM

## 2014-05-30 DIAGNOSIS — R102 Pelvic and perineal pain: Secondary | ICD-10-CM

## 2014-05-30 DIAGNOSIS — IMO0002 Reserved for concepts with insufficient information to code with codable children: Secondary | ICD-10-CM

## 2014-05-30 DIAGNOSIS — N920 Excessive and frequent menstruation with regular cycle: Secondary | ICD-10-CM

## 2014-05-30 DIAGNOSIS — Z9882 Breast implant status: Secondary | ICD-10-CM

## 2014-05-30 HISTORY — DX: Breast implant status: Z98.82

## 2014-05-30 LAB — COMPREHENSIVE METABOLIC PANEL
ALBUMIN: 4.3 g/dL (ref 3.5–5.2)
ALT: 12 U/L (ref 0–35)
AST: 15 U/L (ref 0–37)
Alkaline Phosphatase: 56 U/L (ref 39–117)
BUN: 6 mg/dL (ref 6–23)
CALCIUM: 9.3 mg/dL (ref 8.4–10.5)
CHLORIDE: 102 meq/L (ref 96–112)
CO2: 25 meq/L (ref 19–32)
Creat: 0.72 mg/dL (ref 0.50–1.10)
Glucose, Bld: 79 mg/dL (ref 70–99)
Potassium: 4 mEq/L (ref 3.5–5.3)
Sodium: 138 mEq/L (ref 135–145)
Total Bilirubin: 0.6 mg/dL (ref 0.2–1.2)
Total Protein: 6.9 g/dL (ref 6.0–8.3)

## 2014-05-30 LAB — LIPID PANEL
Cholesterol: 129 mg/dL (ref 0–200)
HDL: 58 mg/dL (ref >39)
LDL Cholesterol: 63 mg/dL (ref 0–99)
TRIGLYCERIDES: 38 mg/dL (ref <150)
Total CHOL/HDL Ratio: 2.2 Ratio
VLDL: 8 mg/dL (ref 0–40)

## 2014-05-30 LAB — TSH: TSH: 1.76 u[IU]/mL (ref 0.350–4.500)

## 2014-05-30 NOTE — Progress Notes (Signed)
Patient ID: Alexis [name], female   DOB: Nov 27, 1985, 28 y.o.   MRN: [id number] Subjective:   Alexis [name] is a 28 y.o. 823P3003 Caucasian female here for a routine well-woman exam.  Patient's last menstrual period was 05/16/2014.    Current complaints: continued pelvic pain, intermittent pelvic pressure x ~8936yr- feels like a UTI, but had gone to PCP prior to our last visit and that was ruled out. Painful sex and spotting/bleeding after sex. No urinary frequency/dysuria. I had checked wet prep and gc/ct at our last visit, both of which were neg, she tried metrogel which hasn't helped. Heavy periods monthly, changes super tampon q 1hr w/ some clots, last ~7days, w/ bad cramps. S/P BTL. PCP: Dr. 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  Past Medical History Past Medical History  Diagnosis Date  . No pertinent past medical history   . Depression   . OCD (obsessive compulsive disorder)   . Anxiety   . Bipolar disorder     Past Surgical History Past Surgical History  Procedure Laterality Date  . Tubal ligation  02/18/2012    Procedure: POST PARTUM TUBAL LIGATION;  Surgeon: Allie BossierMyra C Dove, MD;  Location: [facility];  Service: Gynecology;  Laterality: Bilateral;    Gynecologic History G3P3003  Patient's last menstrual period was 05/16/2014. Contraception: tubal ligation Last Pap: unsure. Results were: unsure Last mammogram: never. Results were: n/a Last TCS: never  Obstetric History OB History  Gravida Para Term Preterm AB SAB TAB Ectopic  Multiple Living  3 3 3  0 0 0    3    # Outcome Date GA Lbr Len/2nd Weight Sex Delivery Anes PTL Lv  3 Term 02/17/12 [id number] 14:44 / 00:05 7 lb 2.3 oz (3.24 kg) F Vag-Spont EPI  Y  2 Term           1 Term               Current Medications Current Outpatient Prescriptions on File Prior to Visit  Medication Sig Dispense Refill  . ARIPiprazole (ABILIFY) 10 MG tablet Take 1 tablet (10 mg total) by mouth 2 (two) times daily. For depression and as adjuvant therapy for mood stabilization. (Patient not taking: Reported on 05/30/2014) 60 tablet 0  . hydrOXYzine (ATARAX/VISTARIL) 50 MG tablet Take 1 tablet (50 mg total) by mouth 2 (two) times daily. For anxiety. (Patient not taking: Reported on 05/30/2014) 60 tablet 0  . lamoTRIgine (LAMICTAL) 25 MG tablet Take 1 tablet daily for 2 weeks, then take 2 tablets daily for 2 weeks, then take 4 tablets daily. (Patient not taking: Reported on 05/30/2014) 42 tablet 0  . metroNIDAZOLE (METROGEL VAGINAL) 0.75 % vaginal gel Place 1 Applicatorful vaginally at bedtime. X 5 nights, no alcohol or sex while using (Patient not taking: Reported on 05/30/2014) 70 g 0   No current facility-administered medications on file prior to visit.    Review of Systems Patient denies any headaches, blurred vision, shortness of breath, chest pain, abdominal pain, problems with bowel movements, urination, or intercourse.  Objective:  BP 108/60 mmHg  Ht 5' 5.5" (  1.664 m)  Wt 117 lb (53.071 kg)  BMI 19.17 kg/m2  LMP 05/16/2014 Physical Exam  General:  Well developed, well nourished, no acute distress. She is alert and oriented x3. Skin:  Warm and dry Neck:  Midline trachea, no thyromegaly or nodules Cardiovascular: Regular rate and rhythm, no murmur heard Lungs:  Effort normal, all lung fields clear to auscultation bilaterally RIBS: Pt w/ very long sternum/ribs, xiphoid process is not far above umbilicus, last rib on Lt is 1 fb from Lt iliac crest Breasts:  No dominant palpable  mass, retraction, or nipple discharge, pt does have bilateral breast implants Abdomen:  Soft, no hepatosplenomegaly or masses. + generalized tenderness w/ palpation Pelvic:  External genitalia is normal in appearance.  The vagina is normal in appearance, small amount creamy white nonodorous d/c. The cervix is bulbous, no CMT.  Thin prep pap is done w/ reflex HR HPV cotesting. Uterus is felt to be normal size, shape, and contour.  No adnexal masses. +tenderness all over- uterine & adnexal Extremities:  No swelling or varicosities noted Psych:  She has a normal mood and affect  Assessment:   Healthy well-woman exam Continued pelvic pain/pressure Menorrhagia and dysmenorrhea Dyspareunia Very long sternum/ribcage  Plan:  CBC, CMP, TSH, Lipid panel, and urine cx today F/U 1wk for pelvic u/s and f/u w/ me, or sooner if needed Mammogram @ 28yo or sooner if problems Colonoscopy @28yo  or sooner if problems  Marge DuncansBooker, Litzy Dicker Randall CNM, [facility]NP-BC 05/30/2014 11:04 AM

## 2014-05-31 LAB — CBC
HEMATOCRIT: 35.9 % — AB (ref 36.0–46.0)
HEMOGLOBIN: 12.4 g/dL (ref 12.0–15.0)
MCH: 29.3 pg (ref 26.0–34.0)
MCHC: 34.5 g/dL (ref 30.0–36.0)
MCV: 84.9 fL (ref 78.0–100.0)
MPV: 11.2 fL (ref 9.4–12.4)
PLATELETS: 240 10*3/uL (ref 150–400)
RBC: 4.23 MIL/uL (ref 3.87–5.11)
RDW: 13.8 % (ref 11.5–15.5)
WBC: 8 10*3/uL (ref 4.0–10.5)

## 2014-05-31 LAB — CYTOLOGY - PAP

## 2014-06-01 LAB — URINE CULTURE
Colony Count: NO GROWTH
Organism ID, Bacteria: NO GROWTH

## 2014-06-06 ENCOUNTER — Encounter: Payer: Self-pay | Admitting: Women's Health

## 2014-06-06 ENCOUNTER — Ambulatory Visit (INDEPENDENT_AMBULATORY_CARE_PROVIDER_SITE_OTHER): Payer: 59 | Admitting: Women's Health

## 2014-06-06 ENCOUNTER — Ambulatory Visit (INDEPENDENT_AMBULATORY_CARE_PROVIDER_SITE_OTHER): Payer: 59

## 2014-06-06 ENCOUNTER — Other Ambulatory Visit: Payer: Self-pay | Admitting: Women's Health

## 2014-06-06 VITALS — BP 98/60 | Ht 65.0 in | Wt 117.0 lb

## 2014-06-06 DIAGNOSIS — IMO0002 Reserved for concepts with insufficient information to code with codable children: Secondary | ICD-10-CM

## 2014-06-06 DIAGNOSIS — R102 Pelvic and perineal pain unspecified side: Secondary | ICD-10-CM | POA: Insufficient documentation

## 2014-06-06 DIAGNOSIS — N941 Dyspareunia: Secondary | ICD-10-CM

## 2014-06-06 MED ORDER — DOXYCYCLINE HYCLATE 100 MG PO CAPS: 100.0000 mg | ORAL_CAPSULE | Freq: Two times a day (BID) | ORAL | Status: DC

## 2014-06-06 MED ORDER — ETONOGESTREL-ETHINYL ESTRADIOL 0.12-0.015 MG/24HR VA RING: VAGINAL_RING | VAGINAL | Status: DC

## 2014-06-06 NOTE — Addendum Note (Signed)
Addended by: Cheral MarkerBOOKER, Sharonna Vinje R on: 06/06/2014 03:55 PM   Modules accepted: Orders

## 2014-06-06 NOTE — Progress Notes (Addendum)
Patient ID: Alexis Alexis [name] Fritcher, female   DOB: 1986-02-07, 28 y.o.   MRN: [id number]   [facility]  Patient name: Alexis Alexis [name] MRN [id number]  Date of birth: 1986-02-07  CC & HPI:  Alexis Alexis [name] Chalk is a 28 y.o. Caucasian female presenting today for f/u after pelvic u/s for pelvic pain/pressure x ~3425yr. Also was noted to have elongated sternum/rib cage at physical last week- pt states she went home and spoke to mom who had extra ribs removed and has scoliosis- so pt was wondering if she may have scoliosis.   Pertinent History Reviewed:  Medical & Surgical Hx:   Past Medical History  Diagnosis Date  . No pertinent past medical history   . Depression   . OCD (obsessive compulsive disorder)   . Anxiety   . Bipolar disorder    Past Surgical History  Procedure Laterality Date  . Tubal ligation  02/18/2012    Procedure: POST PARTUM TUBAL LIGATION;  Surgeon: Allie BossierMyra C Dove, MD;  Location: [facility];  Service: Gynecology;  Laterality: Bilateral;  . Breast enhancement surgery Bilateral    Medications: Reviewed & Updated - see associated section Social History: Reviewed -  reports that she has quit smoking. Her smoking use included Cigarettes. She has a 1 pack-year smoking history. She uses smokeless tobacco.  Objective Findings:  Vitals: BP 98/60 mmHg  Ht 5\' 5"  (1.651 Alexis [name])  Wt 117 lb (53.071 kg)  BMI 19.47 kg/m2  LMP 05/16/2014  Physical Examination: General appearance - alert, well appearing, and in no distress Back exam - full range of motion, no tenderness, palpable spasm or pain on motion, no scoliosis noted- spine appears midline and straight  Pelvic u/s today:  Alexis Alexis [name] is a 28 y.o. G3P3003 LMP 05/16/2014 for a pelvic sonogram for menorrhagia, dysmenorrhea, pelvic pain, dyspareunia x ~1 year. Pt is s/p BTL. Uterus 8.2 x 6.5 x 5.0 cm, anteverted  Endometrium 11.6 mm, symmetrical,  Right ovary 3.1 x 2.3 x 1.9 cm,   Left ovary 4.5 x 2.7 x 2.5 cm, with 2.7 x 1.6cm complex cystic area noted with internal debris noted within, +Ovarian Perfusion noted  No free fluid noted within the pelvis Technician Comments:Anteverted uterus, Endometrium-11.366mm symmetrical, Rt ovary appears WNL, Lt ovary with complex cystic mass noted, no free fluid noted within the pelvis Chari ManningMcBride, Tasha 06/06/2014 2:10 PM  Assessment & Plan:  A:   Pelvic pain/pressure x 525yr  Dyspareunia and postcoital spotting  Small Lt ovarian cyst c/w CL P:  Discussed u/s results and pt complaints w/ JVF- recommended continuous CHC to suppress ovaries to see if it would help pain  Discussed COCs vs. NuvaRing- pt prefers NuvaRing- instructed on continuous use, rx sent, and 1 sample given   Rx doxycycline 100mg  BID x 10d for dyspareunia/post-coital spotting  F/U 3 months for f/u on NuvaRing   Marge DuncansBooker, Starkeisha Vanwinkle Randall CNM, [facility]NP[facility]BC 06/06/2014 2:54 PM

## 2014-06-06 NOTE — Patient Instructions (Signed)
Ethinyl Estradiol; Etonogestrel vaginal ring- Nuva Ring What is this medicine? ETHINYL ESTRADIOL; ETONOGESTREL (ETH in il es tra DYE ole; et oh noe JES trel) vaginal ring is a flexible, vaginal ring used as a contraceptive (birth control method). This medicine combines two types of female hormones, an estrogen and a progestin. This ring is used to prevent ovulation and pregnancy. Each ring is effective for one month. This medicine may be used for other purposes; ask your health care provider or pharmacist if you have questions. COMMON BRAND NAME(S): NuvaRing What should I tell my health care provider before I take this medicine? They need to know if you have or ever had any of these conditions: -abnormal vaginal bleeding -blood vessel disease or blood clots -breast, cervical, endometrial, ovarian, liver, or uterine cancer -diabetes -gallbladder disease -heart disease or recent heart attack -high blood pressure -high cholesterol -kidney disease -liver disease -migraine headaches -stroke -systemic lupus erythematosus (SLE) -tobacco smoker -an unusual or allergic reaction to estrogens, progestins, other medicines, foods, dyes, or preservatives -pregnant or trying to get pregnant -breast-feeding How should I use this medicine? Insert the ring into your vagina as directed. Follow the directions on the prescription label. The ring will remain place for 3 weeks and is then removed for a 1-week break. A new ring is inserted 1 week after the last ring was removed, on the same day of the week. Do not use more often than directed. A patient package insert for the product will be given with each prescription and refill. Read this sheet carefully each time. The sheet may change frequently. Contact your pediatrician regarding the use of this medicine in children. Special care may be needed. This medicine has been used in female children who have started having menstrual periods. Overdosage: If you  think you have taken too much of this medicine contact a poison control center or emergency room at once. NOTE: This medicine is only for you. Do not share this medicine with others. What if I miss a dose? You will need to replace your vaginal ring once a month as directed. If the ring should slip out, or if you leave it in longer or shorter than you should, contact your health care professional for advice. What may interact with this medicine? -acetaminophen -antibiotics or medicines for infections, especially rifampin, rifabutin, rifapentine, and griseofulvin, and possibly penicillins or tetracyclines -aprepitant -ascorbic acid (vitamin C) -atorvastatin -barbiturate medicines, such as phenobarbital -bosentan -carbamazepine -caffeine -clofibrate -cyclosporine -dantrolene -doxercalciferol -felbamate -grapefruit juice -hydrocortisone -medicines for anxiety or sleeping problems, such as diazepam or temazepam -medicines for diabetes, including pioglitazone -modafinil -mycophenolate -nefazodone -oxcarbazepine -phenytoin -prednisolone -ritonavir or other medicines for HIV infection or AIDS -rosuvastatin -selegiline -soy isoflavones supplements -St. John's wort -tamoxifen or raloxifene -theophylline -thyroid hormones -topiramate -warfarin This list may not describe all possible interactions. Give your health care provider a list of all the medicines, herbs, non-prescription drugs, or dietary supplements you use. Also tell them if you smoke, drink alcohol, or use illegal drugs. Some items may interact with your medicine. What should I watch for while using this medicine? Visit your doctor or health care professional for regular checks on your progress. You will need a regular breast and pelvic exam and Pap smear while on this medicine. Use an additional method of contraception during the first cycle that you use this ring. If you have any reason to think you are pregnant, stop  using this medicine right away and contact your doctor or health care   professional. If you are using this medicine for hormone related problems, it may take several cycles of use to see improvement in your condition. Smoking increases the risk of getting a blood clot or having a stroke while you are using hormonal birth control, especially if you are more than 27 years old. You are strongly advised not to smoke. This medicine can make your body retain fluid, making your fingers, hands, or ankles swell. Your blood pressure can go up. Contact your doctor or health care professional if you feel you are retaining fluid. This medicine can make you more sensitive to the sun. Keep out of the sun. If you cannot avoid being in the sun, wear protective clothing and use sunscreen. Do not use sun lamps or tanning beds/booths. If you wear contact lenses and notice visual changes, or if the lenses begin to feel uncomfortable, consult your eye care specialist. In some women, tenderness, swelling, or minor bleeding of the gums may occur. Notify your dentist if this happens. Brushing and flossing your teeth regularly may help limit this. See your dentist regularly and inform your dentist of the medicines you are taking. If you are going to have elective surgery, you may need to stop using this medicine before the surgery. Consult your health care professional for advice. This medicine does not protect you against HIV infection (AIDS) or any other sexually transmitted diseases. What side effects may I notice from receiving this medicine? Side effects that you should report to your doctor or health care professional as soon as possible: -breast tissue changes or discharge -changes in vaginal bleeding during your period or between your periods -chest pain -coughing up blood -dizziness or fainting spells -headaches or migraines -leg, arm or groin pain -severe or sudden headaches -stomach pain (severe) -sudden  shortness of breath -sudden loss of coordination, especially on one side of the body -speech problems -symptoms of vaginal infection like itching, irritation or unusual discharge -tenderness in the upper abdomen -vomiting -weakness or numbness in the arms or legs, especially on one side of the body -yellowing of the eyes or skin Side effects that usually do not require medical attention (report to your doctor or health care professional if they continue or are bothersome): -breakthrough bleeding and spotting that continues beyond the 3 initial cycles of pills -breast tenderness -mood changes, anxiety, depression, frustration, anger, or emotional outbursts -increased sensitivity to sun or ultraviolet light -nausea -skin rash, acne, or brown spots on the skin -weight gain (slight) This list may not describe all possible side effects. Call your doctor for medical advice about side effects. You may report side effects to FDA at 1-800-FDA-1088. Where should I keep my medicine? Keep out of the reach of children. Store at room temperature between 15 and 30 degrees C (59 and 86 degrees F) for up to 4 months. The product will expire after 4 months. Protect from light. Throw away any unused medicine after the expiration date. NOTE: This sheet is a summary. It may not cover all possible information. If you have questions about this medicine, talk to your doctor, pharmacist, or health care provider.  2015, [employer]vier/Gold Standard. (2008-05-31 12:03:58)  

## 2014-06-07 ENCOUNTER — Telehealth: Payer: Self-pay | Admitting: *Deleted

## 2014-06-07 NOTE — Telephone Encounter (Signed)
Pt aware [facility]

## 2014-09-05 ENCOUNTER — Ambulatory Visit: Payer: 59 | Admitting: Women's Health

## 2015-09-18 DIAGNOSIS — H5213 Myopia, bilateral: Secondary | ICD-10-CM | POA: Diagnosis not present

## 2015-11-07 DIAGNOSIS — Z0000 Encounter for general adult medical examination without abnormal findings: Secondary | ICD-10-CM | POA: Diagnosis not present

## 2015-11-07 DIAGNOSIS — Z111 Encounter for screening for respiratory tuberculosis: Secondary | ICD-10-CM | POA: Diagnosis not present

## 2015-11-21 DIAGNOSIS — Z111 Encounter for screening for respiratory tuberculosis: Secondary | ICD-10-CM | POA: Diagnosis not present

## 2015-12-21 DIAGNOSIS — Z1159 Encounter for screening for other viral diseases: Secondary | ICD-10-CM | POA: Diagnosis not present

## 2016-03-17 DIAGNOSIS — Z681 Body mass index (BMI) 19 or less, adult: Secondary | ICD-10-CM | POA: Diagnosis not present

## 2016-03-17 DIAGNOSIS — F411 Generalized anxiety disorder: Secondary | ICD-10-CM | POA: Diagnosis not present

## 2016-04-03 MED FILL — ALPRAZolam 0.5 MG TABS: 0.5 | 30 days supply | Qty: 15 | Fill #0

## 2016-04-17 DIAGNOSIS — F411 Generalized anxiety disorder: Secondary | ICD-10-CM | POA: Diagnosis not present

## 2016-04-17 DIAGNOSIS — Z681 Body mass index (BMI) 19 or less, adult: Secondary | ICD-10-CM | POA: Diagnosis not present

## 2016-04-17 MED FILL — busPIRone HCL 5 MG TABS: 5 | 30 days supply | Qty: 90 | Fill #0

## 2016-06-04 ENCOUNTER — Telehealth: Payer: 59 | Admitting: Family

## 2016-06-04 DIAGNOSIS — J019 Acute sinusitis, unspecified: Secondary | ICD-10-CM

## 2016-06-04 DIAGNOSIS — B9689 Other specified bacterial agents as the cause of diseases classified elsewhere: Secondary | ICD-10-CM

## 2016-06-04 MED ORDER — AMOXICILLIN-POT CLAVULANATE 875-125 MG PO TABS: 1.0000 | ORAL_TABLET | Freq: Two times a day (BID) | ORAL | 0 refills | Status: DC

## 2016-06-04 MED FILL — AMOX-CLAV 875-125 MG TABLET: 875-125 | 7 days supply | Qty: 14 | Fill #0

## 2016-06-04 NOTE — Progress Notes (Signed)

## 2016-08-07 DIAGNOSIS — Z6820 Body mass index (BMI) 20.0-20.9, adult: Secondary | ICD-10-CM | POA: Diagnosis not present

## 2016-08-07 DIAGNOSIS — F419 Anxiety disorder, unspecified: Secondary | ICD-10-CM | POA: Diagnosis not present

## 2016-08-07 MED FILL — ESCITALOPRAM 10 MG TABLET: 10 | 30 days supply | Qty: 30 | Fill #0

## 2016-08-10 MED FILL — ALPRAZolam 0.5 MG TABS: 0.5 | 30 days supply | Qty: 30 | Fill #0

## 2016-08-26 DIAGNOSIS — Z1389 Encounter for screening for other disorder: Secondary | ICD-10-CM | POA: Diagnosis not present

## 2016-08-26 DIAGNOSIS — F419 Anxiety disorder, unspecified: Secondary | ICD-10-CM | POA: Diagnosis not present

## 2016-08-26 DIAGNOSIS — Z6820 Body mass index (BMI) 20.0-20.9, adult: Secondary | ICD-10-CM | POA: Diagnosis not present

## 2016-08-26 MED FILL — BUPROPION HCL XL 150 MG TAB: 150 | 30 days supply | Qty: 30 | Fill #0

## 2016-09-10 MED FILL — ALPRAZolam 0.5 MG TABS: 0.5 | 30 days supply | Qty: 30 | Fill #0

## 2016-09-30 DIAGNOSIS — H5213 Myopia, bilateral: Secondary | ICD-10-CM | POA: Diagnosis not present

## 2017-04-19 MED FILL — FLUCONAZOLE 150 MG TABLET: 150 | 7 days supply | Qty: 2 | Fill #0

## 2017-04-23 DIAGNOSIS — F419 Anxiety disorder, unspecified: Secondary | ICD-10-CM | POA: Diagnosis not present

## 2017-04-23 DIAGNOSIS — R5383 Other fatigue: Secondary | ICD-10-CM | POA: Diagnosis not present

## 2017-04-23 DIAGNOSIS — Z6820 Body mass index (BMI) 20.0-20.9, adult: Secondary | ICD-10-CM | POA: Diagnosis not present

## 2017-04-30 MED FILL — CITALOPRAM HBR 20 MG TABLET: 20 | 30 days supply | Qty: 30 | Fill #0

## 2017-05-10 DIAGNOSIS — F419 Anxiety disorder, unspecified: Secondary | ICD-10-CM | POA: Diagnosis not present

## 2017-05-10 DIAGNOSIS — Z6820 Body mass index (BMI) 20.0-20.9, adult: Secondary | ICD-10-CM | POA: Diagnosis not present

## 2017-05-10 MED FILL — busPIRone HCL 10 MG TABS: 10 | 30 days supply | Qty: 90 | Fill #0

## 2017-05-10 MED FILL — ESCITALOPRAM 10 MG TABLET: 10 | 30 days supply | Qty: 30 | Fill #0

## 2017-06-07 MED FILL — ESCITALOPRAM 10 MG TABLET: 10 | 30 days supply | Qty: 30 | Fill #0

## 2017-07-30 MED FILL — ESCITALOPRAM 10 MG TABLET: 10 | 30 days supply | Qty: 30 | Fill #0

## 2017-07-30 MED FILL — busPIRone HCL 10 MG TABS: 10 | 30 days supply | Qty: 90 | Fill #0

## 2017-08-10 MED FILL — FLUCONAZOLE 150 MG TABLET: 150 | 7 days supply | Qty: 2 | Fill #0

## 2017-12-27 MED FILL — SULFAMETHOXAZOLE-TMP DS TAB: 800-160 | 5 days supply | Qty: 10 | Fill #0

## 2018-05-02 MED FILL — METHYLPREDNISOLONE 4 MG TAB: 4 | 6 days supply | Qty: 21 | Fill #0

## 2020-04-16 ENCOUNTER — Other Ambulatory Visit: Payer: 59

## 2021-06-28 DIAGNOSIS — M79602 Pain in left arm: Secondary | ICD-10-CM | POA: Insufficient documentation

## 2021-06-29 DIAGNOSIS — M5412 Radiculopathy, cervical region: Secondary | ICD-10-CM | POA: Insufficient documentation

## 2021-07-21 DIAGNOSIS — M542 Cervicalgia: Secondary | ICD-10-CM | POA: Insufficient documentation

## 2021-07-30 ENCOUNTER — Inpatient Hospital Stay (HOSPITAL_COMMUNITY): Payer: No Typology Code available for payment source

## 2021-07-30 ENCOUNTER — Observation Stay (HOSPITAL_COMMUNITY)
Admission: EM | Admit: 2021-07-30 | Discharge: 2021-08-01 | Disposition: A | Discharge status: Home / Self Care | Payer: No Typology Code available for payment source | Attending: Emergency Medicine | Admitting: Emergency Medicine

## 2021-07-30 ENCOUNTER — Encounter (HOSPITAL_COMMUNITY): Payer: Self-pay | Admitting: *Deleted

## 2021-07-30 DIAGNOSIS — M50121 Cervical disc disorder at C4-C5 level with radiculopathy: Principal | ICD-10-CM | POA: Insufficient documentation

## 2021-07-30 DIAGNOSIS — Z419 Encounter for procedure for purposes other than remedying health state, unspecified: Secondary | ICD-10-CM

## 2021-07-30 DIAGNOSIS — M5020 Other cervical disc displacement, unspecified cervical region: Secondary | ICD-10-CM | POA: Diagnosis present

## 2021-07-30 DIAGNOSIS — Z20822 Contact with and (suspected) exposure to covid-19: Secondary | ICD-10-CM | POA: Insufficient documentation

## 2021-07-30 HISTORY — DX: Other cervical disc displacement, unspecified cervical region: M50.20

## 2021-07-30 LAB — BASIC METABOLIC PANEL
Anion gap: 10 (ref 5–15)
BUN: 7 mg/dL (ref 6–20)
CO2: 25 mmol/L (ref 22–32)
Calcium: 9 mg/dL (ref 8.9–10.3)
Chloride: 105 mmol/L (ref 98–111)
Creatinine, Ser: 0.83 mg/dL (ref 0.44–1.00)
GFR, Estimated: >60 mL/min (ref >60)
Glucose, Bld: 85 mg/dL (ref 70–99)
Potassium: 3.6 mmol/L (ref 3.5–5.1)
Sodium: 140 mmol/L (ref 135–145)

## 2021-07-30 LAB — SURGICAL PCR SCREEN
MRSA, PCR: NEGATIVE
Staphylococcus aureus: NEGATIVE

## 2021-07-30 LAB — CBC
HCT: 40 % (ref 36.0–46.0)
Hemoglobin: 13.2 g/dL (ref 12.0–15.0)
MCH: 30.7 pg (ref 26.0–34.0)
MCHC: 33 g/dL (ref 30.0–36.0)
MCV: 93 fL (ref 80.0–100.0)
Platelets: 278 10*3/uL (ref 150–400)
RBC: 4.3 MIL/uL (ref 3.87–5.11)
RDW: 12.2 % (ref 11.5–15.5)
WBC: 5.9 10*3/uL (ref 4.0–10.5)
nRBC: 0 % (ref 0.0–0.2)

## 2021-07-30 LAB — RESP PANEL BY RT-PCR (FLU A&B, COVID) ARPGX2
Influenza A by PCR: NEGATIVE
Influenza B by PCR: NEGATIVE
SARS Coronavirus 2 by RT PCR: NEGATIVE

## 2021-07-30 LAB — PREGNANCY, URINE: Preg Test, Ur: NEGATIVE

## 2021-07-30 LAB — HIV ANTIBODY (ROUTINE TESTING W REFLEX): HIV Screen 4th Generation wRfx: NONREACTIVE

## 2021-07-30 LAB — PROTIME-INR
INR: 1 (ref 0.8–1.2)
Prothrombin Time: 13.5 seconds (ref 11.4–15.2)

## 2021-07-30 MED ORDER — ACETAMINOPHEN 500 MG PO TABS
500.0000 mg | ORAL_TABLET | Freq: Four times a day (QID) | ORAL | Status: AC
  Administered 2021-07-30 – 2021-07-31 (×3): 500 mg via ORAL
  Filled 2021-07-30 (×3): qty 1

## 2021-07-30 MED ORDER — METHOCARBAMOL 500 MG PO TABS
500.0000 mg | ORAL_TABLET | Freq: Four times a day (QID) | ORAL | Status: DC | PRN
  Administered 2021-07-30: 500 mg via ORAL
  Filled 2021-07-30: qty 1

## 2021-07-30 MED ORDER — MORPHINE SULFATE (PF) 2 MG/ML IV SOLN: 0.5000 mg | INTRAVENOUS | Status: DC | PRN

## 2021-07-30 MED ORDER — GABAPENTIN 300 MG PO CAPS: 300.0000 mg | ORAL_CAPSULE | Freq: Three times a day (TID) | ORAL | Status: DC | PRN

## 2021-07-30 MED ORDER — LACTATED RINGERS IV SOLN: INTRAVENOUS | Status: DC

## 2021-07-30 MED ORDER — METHOCARBAMOL 1000 MG/10ML IJ SOLN
500.0000 mg | Freq: Four times a day (QID) | INTRAVENOUS | Status: DC | PRN
  Filled 2021-07-30 (×2): qty 5

## 2021-07-30 MED ORDER — ONDANSETRON HCL 4 MG/2ML IJ SOLN: 4.0000 mg | Freq: Four times a day (QID) | INTRAMUSCULAR | Status: DC | PRN

## 2021-07-30 MED ORDER — ONDANSETRON HCL 4 MG PO TABS: 4.0000 mg | ORAL_TABLET | Freq: Four times a day (QID) | ORAL | Status: DC | PRN

## 2021-07-30 MED ORDER — HYDROCODONE-ACETAMINOPHEN 5-325 MG PO TABS
1.0000 | ORAL_TABLET | ORAL | Status: DC | PRN
  Administered 2021-07-31: 1 via ORAL
  Filled 2021-07-30: qty 1

## 2021-07-30 MED ORDER — ACETAMINOPHEN 325 MG PO TABS: 325.0000 mg | ORAL_TABLET | Freq: Four times a day (QID) | ORAL | Status: DC | PRN

## 2021-07-30 MED ORDER — HYDROCODONE-ACETAMINOPHEN 7.5-325 MG PO TABS
1.0000 | ORAL_TABLET | ORAL | Status: DC | PRN
  Administered 2021-07-30: 1 via ORAL
  Filled 2021-07-30: qty 1

## 2021-07-30 NOTE — ED Notes (Signed)
Pt seen by PA in triage for MSE.  Pt has ready bed on 3C.  Pt ambulated without difficulty to 3C bed.  RN aware orders placed by Dr Darden Amber in [facility].

## 2021-07-30 NOTE — ED Provider Notes (Signed)
Dr. Shon Baton has seen in ED, admit orders placed.    Renne Crigler, PA-C 07/30/21 1248    Sloan Leiter, DO 07/30/21 1721

## 2021-07-30 NOTE — ED Provider Triage Note (Signed)
Emergency Medicine Provider Triage Evaluation Note  Alexis [name] , a 36 y.o. female  was evaluated in triage.  Pt complains of worsening left upper extremity pain, shooting, numbness and tingling and weakness.  States she was having some symptoms in her left leg however this is resolved.  Patient has been seen by orthopedics and plan for admit for surgery.  Review of Systems  Positive: Arm pain, weakness Negative: Leg weakness  Physical Exam  BP 138/72 (BP Location: Right Arm)    Pulse 61    Temp 99.1 F (37.3 C) (Oral)    Resp 16    LMP 07/19/2021 (Approximate)    SpO2 100%  Gen:   Awake, no distress   Resp:  Normal effort  MSK:    Other:  Ambulatory without difficulty  Medical Decision Making  Medically screening exam initiated at 1:28 PM.  Appropriate orders placed.  Alexis [name] was informed that the remainder of the evaluation will be completed by another provider, this initial triage assessment does not replace that evaluation, and the importance of remaining in the ED until their evaluation is complete.     Alexis Crigler, PA-C 07/30/21 1329
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
## 2021-07-30 NOTE — ED Triage Notes (Signed)
Instructed by Dr Shon Baton to come to the ED. He was meeting her here. She states she had an MRI in Jan and told she has a herniated disc around C4. Numbness, tingling, and stabbing pain in left arm. Ambulatory without difficulty. Resp e/u.

## 2021-07-31 ENCOUNTER — Other Ambulatory Visit: Payer: Self-pay

## 2021-07-31 ENCOUNTER — Inpatient Hospital Stay (HOSPITAL_COMMUNITY): Payer: No Typology Code available for payment source | Admitting: Critical Care Medicine

## 2021-07-31 ENCOUNTER — Inpatient Hospital Stay (HOSPITAL_COMMUNITY): Payer: No Typology Code available for payment source

## 2021-07-31 ENCOUNTER — Encounter (HOSPITAL_COMMUNITY)
Admission: EM | Disposition: A | Discharge status: Home / Self Care | Payer: Self-pay | Source: Home / Self Care | Attending: Emergency Medicine

## 2021-07-31 ENCOUNTER — Encounter (HOSPITAL_COMMUNITY): Payer: Self-pay | Admitting: Orthopedic Surgery

## 2021-07-31 HISTORY — PX: CERVICAL DISC ARTHROPLASTY: SHX587

## 2021-07-31 SURGERY — CERVICAL ANTERIOR DISC ARTHROPLASTY
Anesthesia: General

## 2021-07-31 MED ORDER — THROMBIN 20000 UNITS EX SOLR
CUTANEOUS | Status: AC
  Filled 2021-07-31: qty 20000

## 2021-07-31 MED ORDER — ACETAMINOPHEN 650 MG RE SUPP: 650.0000 mg | RECTAL | Status: DC | PRN

## 2021-07-31 MED ORDER — ACETAMINOPHEN 160 MG/5ML PO SOLN: 1000.0000 mg | Freq: Once | ORAL | Status: DC | PRN

## 2021-07-31 MED ORDER — MENTHOL 3 MG MT LOZG: 1.0000 | LOZENGE | OROMUCOSAL | Status: DC | PRN

## 2021-07-31 MED ORDER — FENTANYL CITRATE (PF) 100 MCG/2ML IJ SOLN
25.0000 ug | INTRAMUSCULAR | Status: DC | PRN
  Administered 2021-07-31 (×2): 50 ug via INTRAVENOUS

## 2021-07-31 MED ORDER — FENTANYL CITRATE (PF) 250 MCG/5ML IJ SOLN
INTRAMUSCULAR | Status: DC | PRN
Start: 2021-07-31 — End: 2021-07-31
  Administered 2021-07-31 (×5): 50 ug via INTRAVENOUS

## 2021-07-31 MED ORDER — PHENYLEPHRINE HCL-NACL 20-0.9 MG/250ML-% IV SOLN
INTRAVENOUS | Status: DC | PRN
  Administered 2021-07-31: 25 ug/min via INTRAVENOUS

## 2021-07-31 MED ORDER — FENTANYL CITRATE (PF) 100 MCG/2ML IJ SOLN
INTRAMUSCULAR | Status: AC
  Filled 2021-07-31: qty 2

## 2021-07-31 MED ORDER — BUPIVACAINE-EPINEPHRINE 0.25% -1:200000 IJ SOLN
INTRAMUSCULAR | Status: DC | PRN
  Administered 2021-07-31: 4 mL

## 2021-07-31 MED ORDER — HEMOSTATIC AGENTS (NO CHARGE) OPTIME
TOPICAL | Status: DC | PRN
  Administered 2021-07-31: 1 via TOPICAL

## 2021-07-31 MED ORDER — CEFAZOLIN SODIUM-DEXTROSE 1-4 GM/50ML-% IV SOLN
1.0000 g | Freq: Once | INTRAVENOUS | Status: DC
Start: 2021-07-31 — End: 2021-07-31

## 2021-07-31 MED ORDER — PROPOFOL 10 MG/ML IV BOLUS
INTRAVENOUS | Status: AC
  Filled 2021-07-31: qty 20

## 2021-07-31 MED ORDER — OXYCODONE HCL 5 MG PO TABS: 5.0000 mg | ORAL_TABLET | Freq: Once | ORAL | Status: DC | PRN

## 2021-07-31 MED ORDER — ONDANSETRON HCL 4 MG PO TABS: 4.0000 mg | ORAL_TABLET | Freq: Three times a day (TID) | ORAL | 0 refills | Status: DC | PRN

## 2021-07-31 MED ORDER — SODIUM CHLORIDE 0.9 % IV SOLN: 250.0000 mL | INTRAVENOUS | Status: DC

## 2021-07-31 MED ORDER — DEXAMETHASONE SODIUM PHOSPHATE 10 MG/ML IJ SOLN
INTRAMUSCULAR | Status: DC | PRN
  Administered 2021-07-31: 10 mg via INTRAVENOUS

## 2021-07-31 MED ORDER — CEFAZOLIN SODIUM-DEXTROSE 1-4 GM/50ML-% IV SOLN
INTRAVENOUS | Status: AC
  Filled 2021-07-31: qty 50

## 2021-07-31 MED ORDER — ONDANSETRON HCL 4 MG/2ML IJ SOLN
INTRAMUSCULAR | Status: DC | PRN
  Administered 2021-07-31: 4 mg via INTRAVENOUS

## 2021-07-31 MED ORDER — HEMOSTATIC AGENTS (NO CHARGE) OPTIME
TOPICAL | Status: DC | PRN
Start: 2021-07-31 — End: 2021-07-31

## 2021-07-31 MED ORDER — FENTANYL CITRATE (PF) 250 MCG/5ML IJ SOLN
INTRAMUSCULAR | Status: AC
  Filled 2021-07-31: qty 5

## 2021-07-31 MED ORDER — PHENYLEPHRINE 40 MCG/ML (10ML) SYRINGE FOR IV PUSH (FOR BLOOD PRESSURE SUPPORT)
PREFILLED_SYRINGE | INTRAVENOUS | Status: DC | PRN
  Administered 2021-07-31 (×7): 40 ug via INTRAVENOUS

## 2021-07-31 MED ORDER — METHOCARBAMOL 500 MG PO TABS
500.0000 mg | ORAL_TABLET | Freq: Three times a day (TID) | ORAL | 0 refills | Status: AC | PRN
Start: 2021-07-31 — End: 2021-08-05

## 2021-07-31 MED ORDER — CEFAZOLIN SODIUM-DEXTROSE 2-3 GM-%(50ML) IV SOLR
INTRAVENOUS | Status: DC | PRN
  Administered 2021-07-31: 2 g via INTRAVENOUS

## 2021-07-31 MED ORDER — CEFAZOLIN IN SODIUM CHLORIDE 3-0.9 GM/100ML-% IV SOLN: 3.0000 g | Freq: Once | INTRAVENOUS | Status: DC

## 2021-07-31 MED ORDER — THROMBIN 20000 UNITS EX SOLR
CUTANEOUS | Status: DC | PRN
  Administered 2021-07-31: 20000 [IU] via TOPICAL

## 2021-07-31 MED ORDER — METHOCARBAMOL 1000 MG/10ML IJ SOLN
500.0000 mg | Freq: Four times a day (QID) | INTRAVENOUS | Status: DC | PRN
  Filled 2021-07-31: qty 5

## 2021-07-31 MED ORDER — MIDAZOLAM HCL 5 MG/5ML IJ SOLN
INTRAMUSCULAR | Status: DC | PRN
  Administered 2021-07-31: 2 mg via INTRAVENOUS

## 2021-07-31 MED ORDER — OXYCODONE-ACETAMINOPHEN 10-325 MG PO TABS: 1.0000 | ORAL_TABLET | Freq: Four times a day (QID) | ORAL | 0 refills | Status: AC | PRN

## 2021-07-31 MED ORDER — PHENOL 1.4 % MT LIQD: 1.0000 | OROMUCOSAL | Status: DC | PRN

## 2021-07-31 MED ORDER — OXYCODONE HCL 5 MG PO TABS: 5.0000 mg | ORAL_TABLET | ORAL | Status: DC | PRN

## 2021-07-31 MED ORDER — SODIUM CHLORIDE 0.9% FLUSH
3.0000 mL | Freq: Two times a day (BID) | INTRAVENOUS | Status: DC
  Administered 2021-07-31: 3 mL via INTRAVENOUS

## 2021-07-31 MED ORDER — ACETAMINOPHEN 500 MG PO TABS: 1000.0000 mg | ORAL_TABLET | Freq: Once | ORAL | Status: DC | PRN

## 2021-07-31 MED ORDER — ACETAMINOPHEN 10 MG/ML IV SOLN
INTRAVENOUS | Status: AC
  Filled 2021-07-31: qty 100

## 2021-07-31 MED ORDER — BUPIVACAINE-EPINEPHRINE (PF) 0.25% -1:200000 IJ SOLN
INTRAMUSCULAR | Status: AC
  Filled 2021-07-31: qty 30

## 2021-07-31 MED ORDER — TRANEXAMIC ACID-NACL 1000-0.7 MG/100ML-% IV SOLN
INTRAVENOUS | Status: DC | PRN
  Administered 2021-07-31: 1000 mg via INTRAVENOUS

## 2021-07-31 MED ORDER — ACETAMINOPHEN 10 MG/ML IV SOLN
1000.0000 mg | Freq: Once | INTRAVENOUS | Status: DC | PRN
  Administered 2021-07-31: 1000 mg via INTRAVENOUS

## 2021-07-31 MED ORDER — SODIUM CHLORIDE 0.9% FLUSH: 3.0000 mL | INTRAVENOUS | Status: DC | PRN

## 2021-07-31 MED ORDER — OXYCODONE HCL 5 MG/5ML PO SOLN: 5.0000 mg | Freq: Once | ORAL | Status: DC | PRN

## 2021-07-31 MED ORDER — CEFAZOLIN SODIUM-DEXTROSE 1-4 GM/50ML-% IV SOLN
1.0000 g | Freq: Three times a day (TID) | INTRAVENOUS | Status: AC
  Administered 2021-07-31 (×2): 1 g via INTRAVENOUS
  Filled 2021-07-31 (×2): qty 50

## 2021-07-31 MED ORDER — ROCURONIUM BROMIDE 10 MG/ML (PF) SYRINGE
PREFILLED_SYRINGE | INTRAVENOUS | Status: DC | PRN
  Administered 2021-07-31: 60 mg via INTRAVENOUS

## 2021-07-31 MED ORDER — TRANEXAMIC ACID-NACL 1000-0.7 MG/100ML-% IV SOLN
INTRAVENOUS | Status: AC
  Filled 2021-07-31: qty 100

## 2021-07-31 MED ORDER — MIDAZOLAM HCL 2 MG/2ML IJ SOLN
INTRAMUSCULAR | Status: AC
  Filled 2021-07-31: qty 2

## 2021-07-31 MED ORDER — LACTATED RINGERS IV SOLN: INTRAVENOUS | Status: DC

## 2021-07-31 MED ORDER — PROPOFOL 10 MG/ML IV BOLUS
INTRAVENOUS | Status: DC | PRN
  Administered 2021-07-31: 140 mg via INTRAVENOUS

## 2021-07-31 MED ORDER — CEFAZOLIN SODIUM-DEXTROSE 2-4 GM/100ML-% IV SOLN
INTRAVENOUS | Status: AC
  Filled 2021-07-31: qty 100

## 2021-07-31 MED ORDER — 0.9 % SODIUM CHLORIDE (POUR BTL) OPTIME
TOPICAL | Status: DC | PRN
  Administered 2021-07-31: 1000 mL

## 2021-07-31 MED ORDER — ONDANSETRON HCL 4 MG/2ML IJ SOLN: 4.0000 mg | Freq: Four times a day (QID) | INTRAMUSCULAR | Status: DC | PRN

## 2021-07-31 MED ORDER — OXYCODONE HCL 5 MG PO TABS
10.0000 mg | ORAL_TABLET | ORAL | Status: DC | PRN
  Administered 2021-07-31 – 2021-08-01 (×5): 10 mg via ORAL
  Filled 2021-07-31 (×5): qty 2

## 2021-07-31 MED ORDER — ONDANSETRON HCL 4 MG PO TABS: 4.0000 mg | ORAL_TABLET | Freq: Four times a day (QID) | ORAL | Status: DC | PRN

## 2021-07-31 MED ORDER — SURGIFLO WITH THROMBIN (HEMOSTATIC MATRIX KIT) OPTIME
TOPICAL | Status: DC | PRN
  Administered 2021-07-31: 1 via TOPICAL

## 2021-07-31 MED ORDER — ACETAMINOPHEN 325 MG PO TABS
650.0000 mg | ORAL_TABLET | ORAL | Status: DC | PRN
  Administered 2021-07-31 – 2021-08-01 (×2): 650 mg via ORAL
  Filled 2021-07-31 (×2): qty 2

## 2021-07-31 MED ORDER — POLYETHYLENE GLYCOL 3350 17 G PO PACK: 17.0000 g | PACK | Freq: Every day | ORAL | Status: DC | PRN

## 2021-07-31 MED ORDER — GABAPENTIN 300 MG PO CAPS
300.0000 mg | ORAL_CAPSULE | Freq: Three times a day (TID) | ORAL | Status: DC
  Administered 2021-07-31 – 2021-08-01 (×3): 300 mg via ORAL
  Filled 2021-07-31 (×3): qty 1

## 2021-07-31 MED ORDER — METHOCARBAMOL 500 MG PO TABS
500.0000 mg | ORAL_TABLET | Freq: Four times a day (QID) | ORAL | Status: DC | PRN
  Administered 2021-07-31 (×2): 500 mg via ORAL
  Filled 2021-07-31 (×2): qty 1

## 2021-07-31 MED ORDER — SUGAMMADEX SODIUM 200 MG/2ML IV SOLN
INTRAVENOUS | Status: DC | PRN
  Administered 2021-07-31: 120 mg via INTRAVENOUS

## 2021-07-31 MED ORDER — FLEET ENEMA 7-19 GM/118ML RE ENEM: 1.0000 | ENEMA | Freq: Once | RECTAL | Status: DC | PRN

## 2021-07-31 SURGICAL SUPPLY — 61 items
AGENT HMST KT MTR STRL THRMB (HEMOSTASIS) ×1
BAG COUNTER SPONGE SURGICOUNT (BAG) ×2 IMPLANT
BAG SPNG CNTER NS LX DISP (BAG) ×1
BLADE CLIPPER SURG (BLADE) IMPLANT
CANISTER SUCT 3000ML PPV (MISCELLANEOUS) ×2 IMPLANT
CLSR STERI-STRIP ANTIMIC 1/2X4 (GAUZE/BANDAGES/DRESSINGS) ×2 IMPLANT
COLLAR CERV LO CONTOUR FIRM DE (SOFTGOODS) ×1 IMPLANT
COVER MAYO STAND STRL (DRAPES) ×4 IMPLANT
COVER SURGICAL LIGHT HANDLE (MISCELLANEOUS) ×2 IMPLANT
DRAIN CHANNEL 15F RND FF W/TCR (WOUND CARE) IMPLANT
DRAPE C-ARM 42X72 X-RAY (DRAPES) ×2 IMPLANT
DRAPE C-ARMOR (DRAPES) IMPLANT
DRAPE POUCH INSTRU U-SHP 10X18 (DRAPES) ×2 IMPLANT
DRAPE SURG 17X23 STRL (DRAPES) ×2 IMPLANT
DRAPE U-SHAPE 47X51 STRL (DRAPES) ×2 IMPLANT
DRSG OPSITE POSTOP 3X4 (GAUZE/BANDAGES/DRESSINGS) ×2 IMPLANT
DRSG OPSITE POSTOP 4X6 (GAUZE/BANDAGES/DRESSINGS) ×1 IMPLANT
DURAPREP 26ML APPLICATOR (WOUND CARE) ×2 IMPLANT
ELECT COATED BLADE 2.86 ST (ELECTRODE) ×2 IMPLANT
ELECT PENCIL ROCKER SW 15FT (MISCELLANEOUS) ×2 IMPLANT
ELECT REM PT RETURN 9FT ADLT (ELECTROSURGICAL) ×2
ELECTRODE REM PT RTRN 9FT ADLT (ELECTROSURGICAL) ×1 IMPLANT
GLOVE SURG ENC MOIS LTX SZ6.5 (GLOVE) ×2 IMPLANT
GLOVE SURG MICRO LTX SZ8.5 (GLOVE) ×2 IMPLANT
GLOVE SURG UNDER POLY LF SZ6.5 (GLOVE) ×2 IMPLANT
GLOVE SURG UNDER POLY LF SZ8.5 (GLOVE) ×2 IMPLANT
GOWN STRL REUS W/ TWL LRG LVL3 (GOWN DISPOSABLE) ×2 IMPLANT
GOWN STRL REUS W/TWL 2XL LVL3 (GOWN DISPOSABLE) ×2 IMPLANT
GOWN STRL REUS W/TWL LRG LVL3 (GOWN DISPOSABLE) ×4
KIT BASIN OR (CUSTOM PROCEDURE TRAY) ×2 IMPLANT
KIT TURNOVER KIT B (KITS) ×2 IMPLANT
NDL HYPO 25GX1X1/2 BEV (NEEDLE) IMPLANT
NDL SPNL 18GX3.5 QUINCKE PK (NEEDLE) ×1 IMPLANT
NEEDLE HYPO 25GX1X1/2 BEV (NEEDLE) ×2 IMPLANT
NEEDLE SPNL 18GX3.5 QUINCKE PK (NEEDLE) ×2 IMPLANT
NS IRRIG 1000ML POUR BTL (IV SOLUTION) ×2 IMPLANT
PACK ORTHO CERVICAL (CUSTOM PROCEDURE TRAY) ×2 IMPLANT
PACK UNIVERSAL I (CUSTOM PROCEDURE TRAY) ×2 IMPLANT
PAD ARMBOARD 7.5X6 YLW CONV (MISCELLANEOUS) ×4 IMPLANT
PIN DISTRACTION MAXCESS-C 14 (PIN) ×2 IMPLANT
POSITIONER HEAD DONUT 9IN (MISCELLANEOUS) ×2 IMPLANT
PROS DISC CERV SIMPLIFY SM H4 (Neuro Prosthesis/Implant) ×2 IMPLANT
PROSTHESIS DISC CERV SMP SM H4 (Neuro Prosthesis/Implant) IMPLANT
RESTRAINT LIMB HOLDER UNIV (RESTRAINTS) ×2 IMPLANT
SPONGE INTESTINAL PEANUT (DISPOSABLE) ×2 IMPLANT
SPONGE SURGIFOAM ABS GEL SZ50 (HEMOSTASIS) ×2 IMPLANT
SPONGE T-LAP 4X18 ~~LOC~~+RFID (SPONGE) ×2 IMPLANT
STRIP CLOSURE SKIN 1/2X4 (GAUZE/BANDAGES/DRESSINGS) ×1 IMPLANT
SURGIFLO W/THROMBIN 8M KIT (HEMOSTASIS) ×2 IMPLANT
SUT BONE WAX W31G (SUTURE) ×2 IMPLANT
SUT MNCRL AB 3-0 PS2 27 (SUTURE) ×2 IMPLANT
SUT SILK 3 0 TIES 17X18 (SUTURE) ×2
SUT SILK 3-0 18XBRD TIE BLK (SUTURE) IMPLANT
SUT VIC AB 2-0 CT1 18 (SUTURE) ×2 IMPLANT
SYR BULB IRRIG 60ML STRL (SYRINGE) ×2 IMPLANT
SYR CONTROL 10ML LL (SYRINGE) ×1 IMPLANT
TAPE CLOTH 4X10 WHT NS (GAUZE/BANDAGES/DRESSINGS) ×2 IMPLANT
TAPE UMBILICAL COTTON 1/8X30 (MISCELLANEOUS) ×3 IMPLANT
TOWEL GREEN STERILE (TOWEL DISPOSABLE) ×2 IMPLANT
TOWEL GREEN STERILE FF (TOWEL DISPOSABLE) ×2 IMPLANT
WATER STERILE IRR 1000ML POUR (IV SOLUTION) ×2 IMPLANT

## 2021-07-31 NOTE — Progress Notes (Signed)
Orthopedic Tech Progress Note Patient Details:  Alexis [name] 11/06/1985 [id number]  Patient ID: [name], female   DOB: May 22, 1986, 36 y.o.   MRN: [id number]  [name] [name] 07/31/2021, 1:43 PM Patient already had collar.
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
## 2021-07-31 NOTE — Anesthesia Preprocedure Evaluation (Signed)
Anesthesia Evaluation  Patient identified by MRN, date of birth, ID band Patient awake    Reviewed: Allergy & Precautions, NPO status , Patient's Chart, lab work & pertinent test results  History of Anesthesia Complications Negative for: history of anesthetic complications  Airway Mallampati: II  TM Distance: >3 FB Neck ROM: Full    Dental  (+) Dental Advisory Given, Teeth Intact   Pulmonary neg shortness of breath, neg COPD, neg recent URI, former smoker,    breath sounds clear to auscultation       Cardiovascular negative cardio ROS   Rhythm:Regular     Neuro/Psych negative neurological ROS     GI/Hepatic negative GI ROS, Neg liver ROS,   Endo/Other  negative endocrine ROS  Renal/GU negative Renal ROS     Musculoskeletal negative musculoskeletal ROS (+)   Abdominal   Peds  Hematology negative hematology ROS (+)   Anesthesia Other Findings   Reproductive/Obstetrics                             Anesthesia Physical Anesthesia Plan  ASA: 1  Anesthesia Plan: General   Post-op Pain Management: Tylenol PO (pre-op)   Induction: Intravenous  PONV Risk Score and Plan: 3 and Ondansetron, Dexamethasone and Midazolam  Airway Management Planned: Oral ETT  Additional Equipment: None  Intra-op Plan:   Post-operative Plan: Extubation in OR  Informed Consent: I have reviewed the patients History and Physical, chart, labs and discussed the procedure including the risks, benefits and alternatives for the proposed anesthesia with the patient or authorized representative who has indicated his/her understanding and acceptance.     Dental advisory given  Plan Discussed with: CRNA and Anesthesiologist  Anesthesia Plan Comments:         Anesthesia Quick Evaluation

## 2021-07-31 NOTE — Anesthesia Procedure Notes (Signed)
Procedure Name: Intubation Date/Time: 07/31/2021 7:42 AM Performed by: Rachel Moulds, CRNA Pre-anesthesia Checklist: Patient identified, Emergency Drugs available, Suction available, Patient being monitored and Timeout performed Patient Re-evaluated:Patient Re-evaluated prior to induction Oxygen Delivery Method: Circle system utilized Preoxygenation: Pre-oxygenation with 100% oxygen Induction Type: IV induction Ventilation: Mask ventilation without difficulty Laryngoscope Size: Glidescope Grade View: Grade I Tube type: Oral Tube size: 7.0 mm Number of attempts: 1 Placement Confirmation: positive ETCO2, ETT inserted through vocal cords under direct vision, CO2 detector and breath sounds checked- equal and bilateral Secured at: 20 cm Tube secured with: Tape Dental Injury: Teeth and Oropharynx as per pre-operative assessment

## 2021-07-31 NOTE — Progress Notes (Signed)
° ° °  Subjective:  Patient reports pain as 8 on 0-10 scale.  Reports increased arm pain reports incisional neck pain   Positive void Negative bowel movement Positive flatus Negative chest pain or shortness of breath  Objective: Vital signs in last 24 hours: Temp:  [97.8 F (36.6 C)-99.1 F (37.3 C)] 98 F (36.7 C) (02/02 0427) Pulse Rate:  [51-62] 53 (02/02 0427) Resp:  [16-20] 18 (02/02 0427) BP: (99-138)/(55-78) 106/55 (02/02 0427) SpO2:  [99 %-100 %] 100 % (02/02 0427) Weight:  [126 kg] 126 kg (02/01 1851)  Intake/Output from previous day: No intake/output data recorded.  Labs: Recent Labs    07/30/21 1251  WBC 5.9  RBC 4.30  HCT 40.0  PLT 278   Recent Labs    07/30/21 1251  NA 140  K 3.6  CL 105  CO2 25  BUN 7  CREATININE 0.83  GLUCOSE 85  CALCIUM 9.0   Recent Labs    07/30/21 1251  INR 1.0    Physical Exam: ABD soft Intact pulses distally Dorsiflexion/Plantar flexion intact Compartment soft No change in neuro exam Body mass index is 44.83 kg/m.  Assessment/Plan: Patient stable  MRI: no change from 12/22 images. Positive left C4/5 NHP with C5 nerve compression.  No cord signal changes. Patient continues to have significant radicular left arm pain with both motor and sensory deficits.  She shows no signs of myelopathy.  As result of the severe pain we have elected to move forward with surgery.  I reviewed the surgical procedure which was a cervical total disc replacement at C4-5 with the patient and we have again reviewed the risks, benefits, and alternatives to surgery and consent was signed.  Patient has expressed a willingness and desire to move forward with surgery.  Venita Lick, MD [facility] [phone]

## 2021-07-31 NOTE — Anesthesia Postprocedure Evaluation (Signed)
Anesthesia Post Note  Patient: [place]  Procedure(s) Performed: CERVICAL FOUR  THROUGH FIVE ANTERIOR DISC ARTHROPLASTY     Patient location during evaluation: PACU Anesthesia Type: General Level of consciousness: awake and alert Pain management: pain level controlled Vital Signs Assessment: post-procedure vital signs reviewed and stable Respiratory status: spontaneous breathing, nonlabored ventilation, respiratory function stable and patient connected to nasal cannula oxygen Cardiovascular status: blood pressure returned to baseline and stable Postop Assessment: no apparent nausea or vomiting Anesthetic complications: no   No notable events documented.  Last Vitals:  Vitals:   07/31/21 1115 07/31/21 1130  BP: 99/60 106/73  Pulse: 76 79  Resp: 13 16  Temp: 37.4 C   SpO2: 94% 95%    Last Pain:  Vitals:   07/31/21 1205  TempSrc:   PainSc: 8                  Elvena Oyer

## 2021-07-31 NOTE — Op Note (Signed)
OPERATIVE REPORT  DATE OF SURGERY: 07/31/2021  PATIENT NAME:  Alexis [name] MRN: [id number] DOB: May 06, 1986  PCP: Selinda Flavin, MD  PRE-OPERATIVE DIAGNOSIS: Cervical disc herniation C4-5 with left C5 radiculopathy  POST-OPERATIVE DIAGNOSIS: Same  PROCEDURE:   Total disc arthroplasty C4-5  SURGEON:  Venita Lick, MD  PHYSICIAN ASSISTANT: Voncille Lo, PA  ANESTHESIA:   General  EBL: 25 ml   Complications: None  Implants: Simplify, small (15 x 12) 4 mm deep disc arthroplasty  BRIEF HISTORY: Alexis [name] is a 36 y.o. female who presented to my office last week with complaints of significant neck and left radicular arm pain.  Imaging demonstrated significant deltoid weakness and neural compression due to a disc herniation.  She contacted my office yesterday because of progressively worsening symptoms and was instructed to go to the emergency room.  The patient was admitted through the emergency room and repeat imaging demonstrated that the disc herniation had not changed in size.  Her clinical exam however was more concerning as she had progressive weakness and debilitating pain.  As a result we elected to move forward with surgery.  All appropriate risks benefits and alternatives were discussed with the patient and consent was obtained.  PROCEDURE DETAILS: Patient was brought into the operating room and was properly positioned on the operating room table.  After induction with general anesthesia the patient was endotracheally intubated.  A timeout was taken to confirm all important data: including patient, procedure, and the level. Teds, SCD's were applied.   The anterior cervical spine was prepped and draped in a sterile fashion.  Think it was placed behind the neck in order to maintain cervical lordosis.  Imaging was used to identify the midline of the cervical spine and the C4-5 disc space level.  The left transverse incision was then infiltrated with quarter percent  Marcaine and a 2 inch incision was made.  Sharp dissection was carried out down to and through the platysma.  I continued to bluntly dissect along the medial border the sternocleidomastoid performing a standard Smith-Robinson approach to the cervical spine.  Identified and protected the carotid sheath laterally with my finger and then swept the esophagus and trachea to the right and protected with a hand-held retractor.  I then used Kitner dissectors to dissect through the remainder of the deep cervical and prevertebral fascia to expose the anterior longitudinal ligament.  I then placed a needle into the C4-5 disc space and took an intraoperative fluoroscopy view to confirm I was at the appropriate level.  Once we confirm this I then marked the disc space and then used bipolar electrocautery to mobilize the longus coli muscle from the superior aspect of C4 to the inferior aspect of C5 bilaterally.  Caspar self-retaining retractors were then placed underneath the longus coli muscle and the endotracheal cuff was deflated and the retractor was expanded and the endotracheal cuff reinflated.  Annulotomy was performed with a 15 blade scalpel and I remove the bulk of the disc material with pituitary rongeurs.  Using AP fluoroscopy identified the midline of the vertebral body based on the location of the spinous process.  I then placed my distraction pins so they were parallel to the endplates.  I then distracted the intervertebral space with a lamina spreader and maintained his distraction with the distraction pin set.  Using curettes I remove the rest of the disc and collagenous portion of the endplate.  I then used a nerve hook to begin dissecting the left  posterior lateral corner.  I was able to mobilize and removed 2 fragments of disc material that was consistent with what was seen on the preoperative MRI.  Once I had remove the fragment I then continue to gently dissect through the remainder of the posterior  longitudinal ligament until I could create a plane between the PLL and the thecal sac.  Using my 1 mm Kerrison rongeur I resected the PLL in its entirety.  For now I had excellent visualization of the thecal sac.  I then undercut the uncovertebral joints bilaterally to improve the foraminal volume.  I could now freely sweep my nerve hook behind the vertebral body of C5 and C4 confirming an adequate decompression.  I also mobilized another small fragment of disc material from the left posterior lateral corner.  Using live fluoroscopy I confirmed that had parallel endplate distraction using the lamina spreader.  At this point time with the discectomy/decompression complete move forward with trialing and placing the implants.  The 4 mm small trial was then inserted and noted to do the right excellent overall fit.  The implant was obtained and then I used the osteotome to create the fin cut in the C4 and C5 endplate.  Once this was properly done I irrigated the wound copiously normal saline and checked 1 last time with my nerve hook to ensure I had an adequate decompression and that there was no bony material that was pushed back during the trialing and endplate prep.  After final irrigation the implant was obtained and malleted into place.  I confirmed satisfactory position with both AP and lateral fluoroscopy.  The implant came to rest appropriately in the midline and was at the appropriate depth.  The distraction pins were then removed and the bleeding holes were closed with bone wax.  All bleeding edges were sealed with bone wax.  The wound was then copes irrigated with normal saline and the retractors were removed and the trachea and esophagus were returned to midline.  Final AP and lateral fluoroscopy views were taken which demonstrated satisfactory position of the implant in both planes.  The platysma was then closed with interrupted 2-0 Vicryl suture, and the skin with a 3-0 Monocryl.  Steri-Strips and a dry  dressing and soft collar were applied.  Patient was extubated transfer the PACU without incident.  The end of the case all needle and sponge counts were correct.  There were no adverse intraoperative events.Venita Lick, MD 07/31/2021 10:04 AM

## 2021-07-31 NOTE — Brief Op Note (Signed)
07/31/2021  10:19 AM  PATIENT:  Alexis [name]  35 y.o. female  PRE-OPERATIVE DIAGNOSIS:  Cervical Four-Five degenerative disc disease  POST-OPERATIVE DIAGNOSIS:  Cervical Four-Five degenerative disc disease  PROCEDURE:  Procedure(s): CERVICAL FOUR  THROUGH FIVE ANTERIOR DISC ARTHROPLASTY (N/A)  SURGEON:  Surgeon(s) and Role:    Venita Lick, MD - Primary  PHYSICIAN ASSISTANT:   ASSISTANTS: Voncille Lo, PA   ANESTHESIA:   general  EBL:  25 mL   BLOOD ADMINISTERED:none  DRAINS: none   LOCAL MEDICATIONS USED:  MARCAINE     SPECIMEN:  No Specimen  DISPOSITION OF SPECIMEN:  N/A  COUNTS:  YES  TOURNIQUET:  * No tourniquets in log *  DICTATION: .Dragon Dictation  PLAN OF CARE: Admit for overnight observation  PATIENT DISPOSITION:  PACU - hemodynamically stable.

## 2021-07-31 NOTE — Discharge Instructions (Signed)

## 2021-07-31 NOTE — Transfer of Care (Signed)
Immediate Anesthesia Transfer of Care Note  Patient: Alexis [name]  Procedure(s) Performed: CERVICAL FOUR  THROUGH FIVE ANTERIOR DISC ARTHROPLASTY  Patient Location: PACU  Anesthesia Type:General  Level of Consciousness: awake, alert  and oriented  Airway & Oxygen Therapy: Patient Spontanous Breathing and Patient connected to nasal cannula oxygen  Post-op Assessment: Report given to RN and Post -op Vital signs reviewed and stable  Post vital signs: Reviewed and stable  Last Vitals:  Vitals Value Taken Time  BP 107/62 07/31/21 1026  Temp    Pulse 87 07/31/21 1027  Resp 15 07/31/21 1027  SpO2 99 % 07/31/21 1027  Vitals shown include unvalidated device data.  Last Pain:  Vitals:   07/31/21 0433  TempSrc:   PainSc: 2       Patients Stated Pain Goal: 2 (07/31/21 0349)  Complications: No notable events documented.

## 2021-08-01 ENCOUNTER — Encounter (HOSPITAL_COMMUNITY): Payer: Self-pay | Admitting: Orthopedic Surgery

## 2021-08-01 NOTE — Progress Notes (Signed)
Subjective: 1 Day Post-Op Procedure(s) (LRB): CERVICAL FOUR  THROUGH FIVE ANTERIOR DISC ARTHROPLASTY (N/A) Patient reports pain as moderate.  Neck and throat pain. Arm pain improved. Still complaining of some arm "heaviness," but overall she can notice an improvement in her strength already. Toleratin PO without N/V. No significant issues swallowing.  +ambulation +void +flatus  Objective: Vital signs in last 24 hours: Temp:  [97.5 F (36.4 C)-99.4 F (37.4 C)] 98.4 F (36.9 C) (02/03 0405) Pulse Rate:  [57-93] 57 (02/03 0405) Resp:  [10-18] 18 (02/03 0405) BP: (95-114)/(60-73) 97/62 (02/03 0405) SpO2:  [90 %-98 %] 98 % (02/03 0405)  Intake/Output from previous day: 02/02 0701 - 02/03 0700 In: 1250 [I.V.:1100; IV Piggyback:150] Out: 25 [Blood:25] Intake/Output this shift: No intake/output data recorded.  Recent Labs    07/30/21 1251  HGB 13.2   Recent Labs    07/30/21 1251  WBC 5.9  RBC 4.30  HCT 40.0  PLT 278   Recent Labs    07/30/21 1251  NA 140  K 3.6  CL 105  CO2 25  BUN 7  CREATININE 0.83  GLUCOSE 85  CALCIUM 9.0   Recent Labs    07/30/21 1251  INR 1.0    Neurologically intact ABD soft Sensation intact distally Intact pulses distally Left arm pain improved. Left deltoid and bicep strength slightly improved.  Dressing: C/D/I  Assessment/Plan: 1 Day Post-Op Procedure(s) (LRB): CERVICAL FOUR  THROUGH FIVE ANTERIOR DISC ARTHROPLASTY (N/A) Advance diet Up with therapy Soft cervical collar when OOB IS encouraged DVT ppx: Teds, SCDs, ambulation  Plan D/C today  F/u in 2 weeks   Charlyne Petrin 08/01/2021, 7:31 AM

## 2021-08-01 NOTE — Progress Notes (Signed)
Patient alert and oriented, mae's well, voiding adequate amount of urine, swallowing without difficulty, no c/o pain at time of discharge. Patient discharged home with husband. Script and discharged instructions given to patient. Patient and husband stated understanding of instructions given. Patient has an appointment with Dr. Shon Baton

## 2021-08-01 NOTE — Discharge Summary (Signed)
Patient ID: Alexis [name] MRN: [id number] DOB/AGE: [birth year] 36 y.o.  Admit date: 07/30/2021 Discharge date: 08/01/2021  Admission Diagnoses:  Principal Problem:   Cervical disc herniation   Discharge Diagnoses:  Principal Problem:   Cervical disc herniation  status post Procedure(s): CERVICAL FOUR  THROUGH FIVE ANTERIOR DISC ARTHROPLASTY  Past Medical History:  Diagnosis Date   Anxiety    Bipolar disorder (HCC)    Depression    No pertinent past medical history    OCD (obsessive compulsive disorder)     Surgeries: Procedure(s): CERVICAL FOUR  THROUGH FIVE ANTERIOR DISC ARTHROPLASTY on 07/31/2021   Consultants:   Discharged Condition: Improved  Hospital Course: Alexis [name] is an 36 y.o. female who was admitted 07/30/2021 for operative treatment of Cervical disc herniation. Patient failed conservative treatments (please see the history and physical for the specifics) and had severe unremitting pain that affects sleep, daily activities and work/hobbies. After pre-op clearance, the patient was taken to the operating room on 07/31/2021 and underwent  Procedure(s): CERVICAL FOUR  THROUGH FIVE ANTERIOR DISC ARTHROPLASTY.    Patient was given perioperative antibiotics:  Anti-infectives (From admission, onward)    Start     Dose/Rate Route Frequency Ordered Stop   07/31/21 1600  ceFAZolin (ANCEF) IVPB 1 g/50 mL premix        1 g 100 mL/hr over 30 Minutes Intravenous Every 8 hours 07/31/21 1135 07/31/21 2333   07/31/21 0731  ceFAZolin (ANCEF) 2-4 GM/100ML-% IVPB       Note to Pharmacy: Samuella Cota O: cabinet override      07/31/21 0731 07/31/21 1944   07/31/21 0730  ceFAZolin (ANCEF) IVPB 1 g/50 mL premix  Status:  Discontinued        1 g 100 mL/hr over 30 Minutes Intravenous  Once 07/31/21 0725 07/31/21 0726   07/31/21 0730  ceFAZolin (ANCEF) IVPB 3g/100 mL premix  Status:  Discontinued        3 g 200 mL/hr over 30 Minutes Intravenous  Once 07/31/21 0726  07/31/21 1135   07/31/21 0725  ceFAZolin (ANCEF) 1-4 GM/50ML-% IVPB  Status:  Discontinued       Note to Pharmacy: Samuella Cota O: cabinet override      07/31/21 0725 07/31/21 0736        Patient was given sequential compression devices and early ambulation to prevent DVT.   Patient benefited maximally from hospital stay and there were no complications. At the time of discharge, the patient was urinating/moving their bowels without difficulty, tolerating a regular diet, pain is controlled with oral pain medications and they have been cleared by PT/OT.   Recent vital signs: Patient Vitals for the past 24 hrs:  BP Temp Temp src Pulse Resp SpO2  08/01/21 1115 94/61 98.9 F (37.2 C) Oral 65 18 99 %  08/01/21 0800 (!) 93/56 98.8 F (37.1 C) Oral 65 18 98 %  08/01/21 0405 97/62 98.4 F (36.9 C) Oral (!) 57 18 98 %  07/31/21 2316 95/62 98.2 F (36.8 C) Oral (!) 57 18 98 %  07/31/21 1918 101/62 97.9 F (36.6 C) Oral 72 18 96 %  07/31/21 1611 101/64 (!) 97.5 F (36.4 C) Oral 72 18 97 %     Recent laboratory studies:  Recent Labs    07/30/21 1251  WBC 5.9  HGB 13.2  HCT 40.0  PLT 278  NA 140  K 3.6  CL 105  CO2 25  BUN 7  CREATININE 0.83  GLUCOSE 85  INR 1.0  CALCIUM 9.0     Discharge Medications:   Allergies as of 08/01/2021   No Known Allergies      Medication List     STOP taking these medications    amoxicillin-clavulanate 875-125 MG tablet Commonly known as: AUGMENTIN   ARIPiprazole 10 MG tablet Commonly known as: ABILIFY   doxycycline 100 MG capsule Commonly known as: VIBRAMYCIN   etonogestrel-ethinyl estradiol 0.12-0.015 MG/24HR vaginal ring Commonly known as: NUVARING   gabapentin 300 MG capsule Commonly known as: NEURONTIN   hydrOXYzine 50 MG tablet Commonly known as: ATARAX   lamoTRIgine 25 MG tablet Commonly known as: LAMICTAL   metroNIDAZOLE 0.75 % vaginal gel Commonly known as: METROGEL VAGINAL   traMADol 50 MG tablet Commonly  known as: ULTRAM       TAKE these medications    methocarbamol 500 MG tablet Commonly known as: Robaxin Take 1 tablet (500 mg total) by mouth every 8 (eight) hours as needed for up to 5 days for muscle spasms.   ondansetron 4 MG tablet Commonly known as: Zofran Take 1 tablet (4 mg total) by mouth every 8 (eight) hours as needed for nausea or vomiting.   oxyCODONE-acetaminophen 10-325 MG tablet Commonly known as: Percocet Take 1 tablet by mouth every 6 (six) hours as needed for up to 5 days for pain.        Diagnostic Studies: DG Cervical Spine 2 or 3 views  Result Date: 07/31/2021 CLINICAL DATA:  C4-C5 disc arthroplasty. EXAM: CERVICAL SPINE - 2-3 VIEW COMPARISON:  MRI cervical spine from yesterday. FLUOROSCOPY TIME:  Radiation Exposure Index (as provided by the fluoroscopic device): 11.06 mGy Kerma C-arm fluoroscopic images were obtained intraoperatively and submitted for post operative interpretation. FINDINGS: Frontal and lateral intraoperative fluoroscopic images demonstrate interval C4-C5 disc arthroplasty, appropriately centered within the disc space. Remaining disc heights are relatively preserved. No acute osseous abnormality. IMPRESSION: Intraoperative fluoroscopic guidance for C4-C5 disc arthroplasty. Electronically Signed   By: Obie DredgeWilliam T Derry M.D.   On: 07/31/2021 11:37   MR CERVICAL SPINE WO CONTRAST  Result Date: 07/30/2021 CLINICAL DATA:  Cervical radiculopathy, no red flags; known C4/5 HNP; increased radicular pain EXAM: MRI CERVICAL SPINE WITHOUT CONTRAST TECHNIQUE: Multiplanar, multisequence MR imaging of the cervical spine was performed. No intravenous contrast was administered. COMPARISON:  06/28/2021 FINDINGS: Alignment: Stable. Vertebrae: Stable vertebral body heights. No substantial marrow edema. No suspicious osseous lesion. Cord: No abnormal signal. Posterior Fossa, vertebral arteries, paraspinal tissues: Unremarkable. Disc levels: C2-C3:  No stenosis. C3-C4:   Minimal disc bulge.  No progressed stenosis. C4-C5: Minimal disc bulge with superimposed left paracentral extrusion with endplate osteophytes. Probable left C5 nerve root compression. Remains narrowing of the inferior left neural foramen. No progressive canal or foraminal stenosis. C5-C6: Minimal disc bulge with superimposed right paracentral protrusion with endplate osteophytes. Remains minor canal and right foraminal stenosis. No left foraminal stenosis. C6-C7:  No canal or foraminal stenosis. C7-T1:  No canal or foraminal stenosis. IMPRESSION: No substantial change since 06/28/2021. As before there is a disc herniation at C4-C5 with potential left C5 nerve root compression. Electronically Signed   By: Guadlupe SpanishPraneil  Patel M.D.   On: 07/30/2021 16:57   DG C-Arm 1-60 Min-No Report  Result Date: 07/31/2021 Fluoroscopy was utilized by the requesting physician.  No radiographic interpretation.   DG C-Arm 1-60 Min-No Report  Result Date: 07/31/2021 Fluoroscopy was utilized by the requesting physician.  No radiographic interpretation.   DG C-Arm 1-60 Min-No Report  Result Date: 07/31/2021 Fluoroscopy was utilized by the requesting physician.  No radiographic interpretation.    Discharge Instructions     Incentive spirometry RT   Complete by: As directed         Follow-up Information     Venita Lick, MD. Schedule an appointment as soon as possible for a visit in 2 week(s).   Specialty: Orthopedic Surgery Why: If symptoms worsen, For suture removal, For wound re-check Contact information: [address] [place] [address] [phone]                 Discharge Plan:  discharge to home  Disposition: stable    Signed: Rhodia Albright for [facility] PA-C [facility] [phone] 08/01/2021, 1:29 PM

## 2022-02-12 IMAGING — MR MR CERVICAL SPINE W/O CM
4 of 5 series · 19 of 48 positions shown · non-contrast
Comparison: 06/28/2021

CLINICAL DATA: Cervical radiculopathy, no red flags; known C4/5
HNP; increased radicular pain

EXAM:
MRI CERVICAL SPINE WITHOUT CONTRAST
TECHNIQUE: Multiplanar, multisequence MR imaging of the cervical spine was
performed. No intravenous contrast was administered.

[Series 3: T2 · sagittal · 3.0mm · 0.43mm/px · 6 of 14 slices shown (1 of 2)]
[im 1/14]
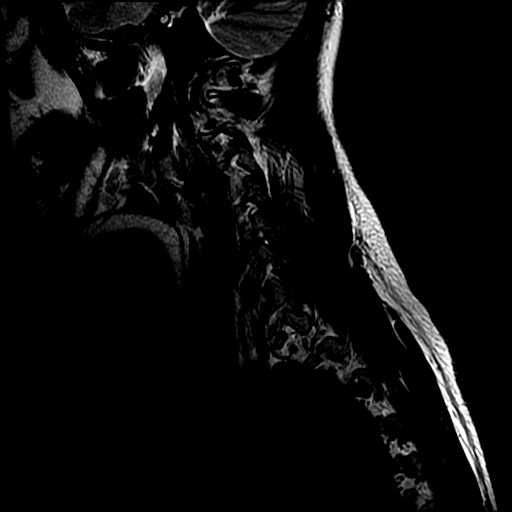
[im 3/14]
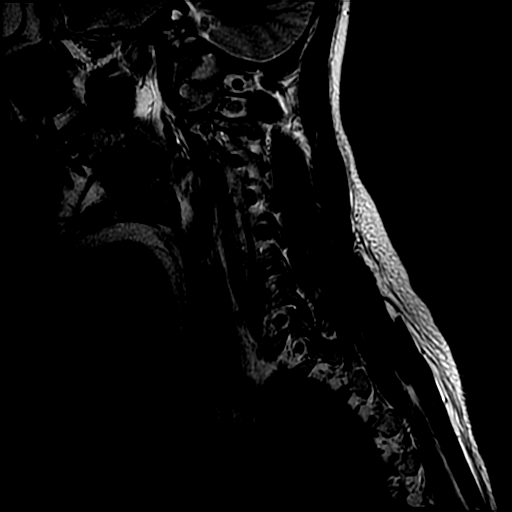
[im 6/14]
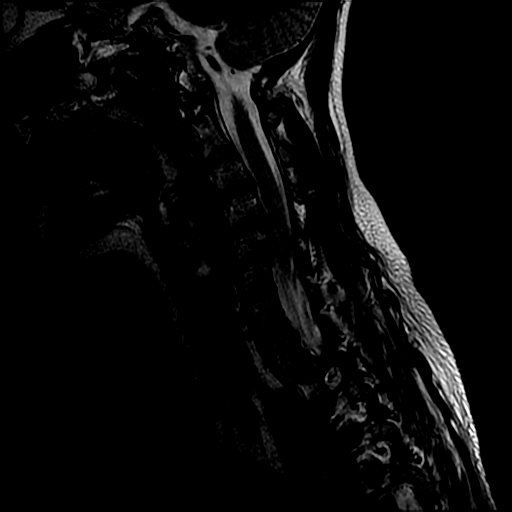
[im 8/14]
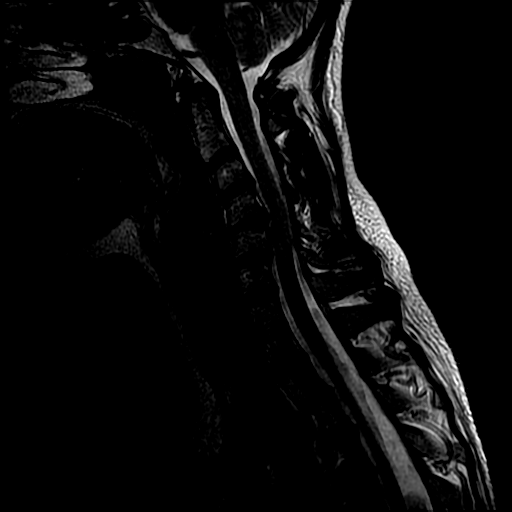
[im 11/14]
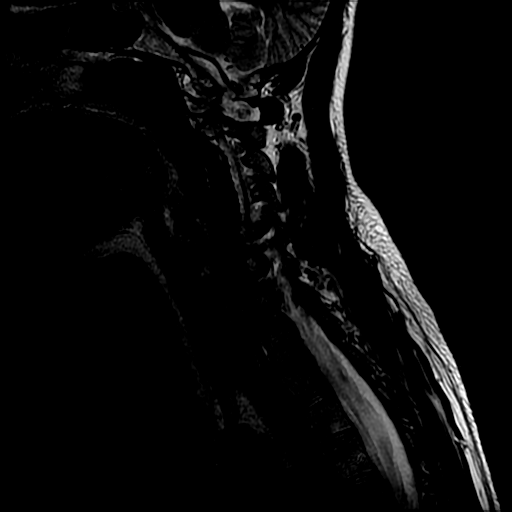
[im 14/14]
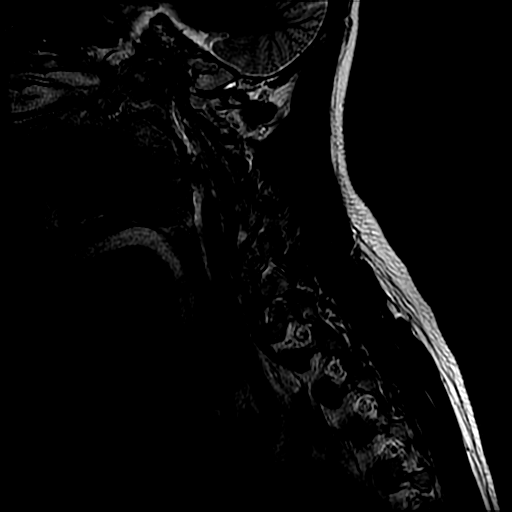

[Series 4: T1 · sagittal · 3.0mm · 0.43mm/px · 3 of 14 slices shown]
[im 3/14]
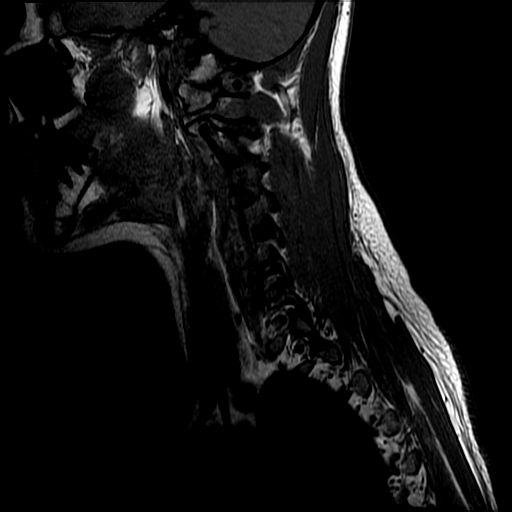
[im 7/14]
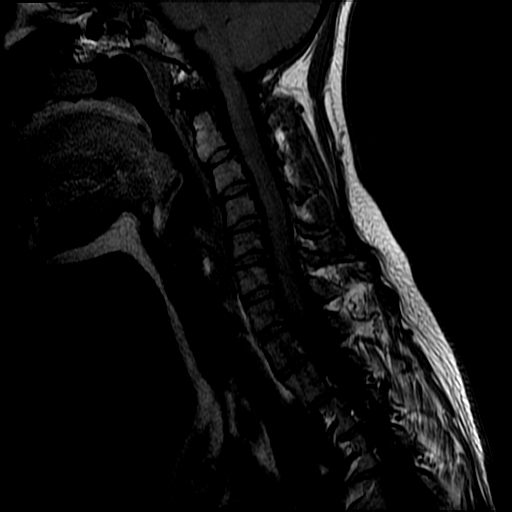
[im 11/14]
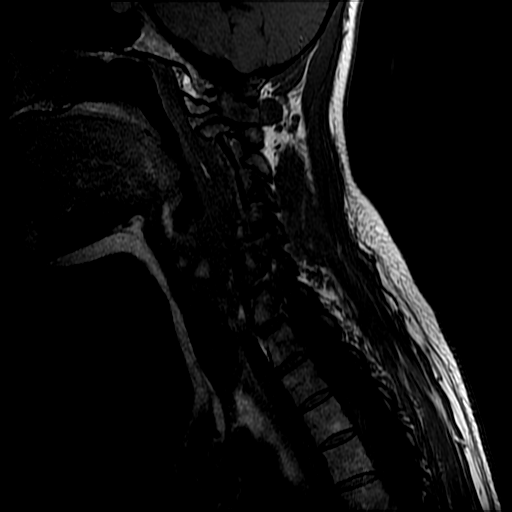

[Series 5: sag ir · sagittal · 3.0mm · 0.43mm/px · 3 of 14 slices shown]
[im 3/14]
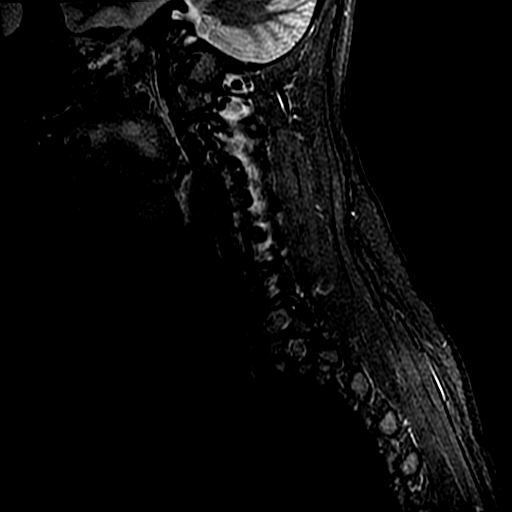
[im 7/14]
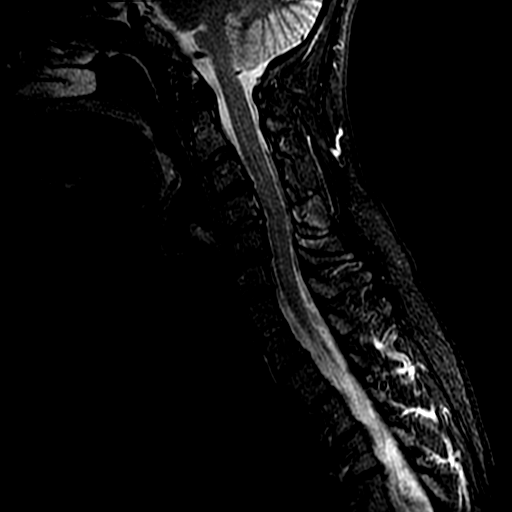
[im 11/14]
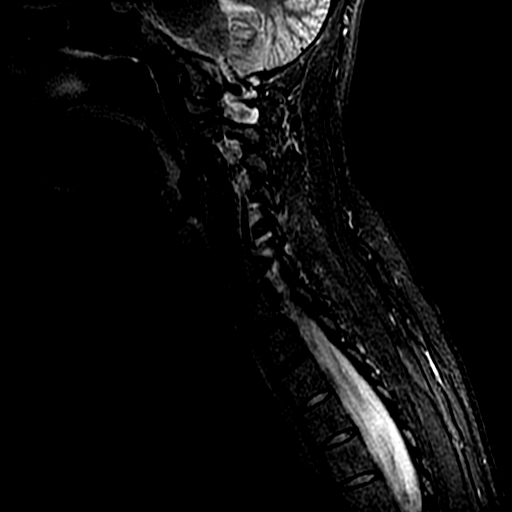

[Series 7: T2 · axial · 3.0mm · 0.39mm/px · z∈[-38,+34]mm · 7 of 27 slices shown (2 of 2)]
[im 1/27]
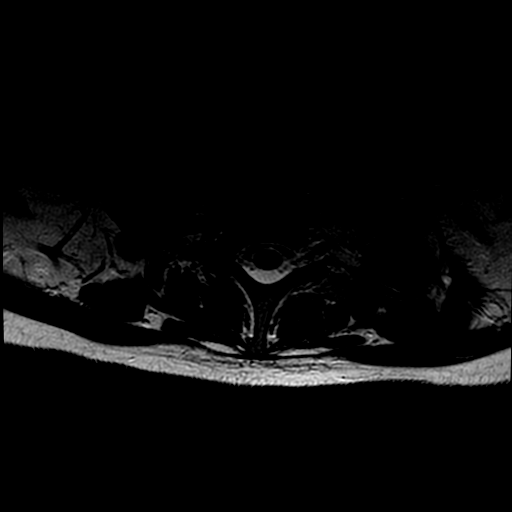
[im 5/27]
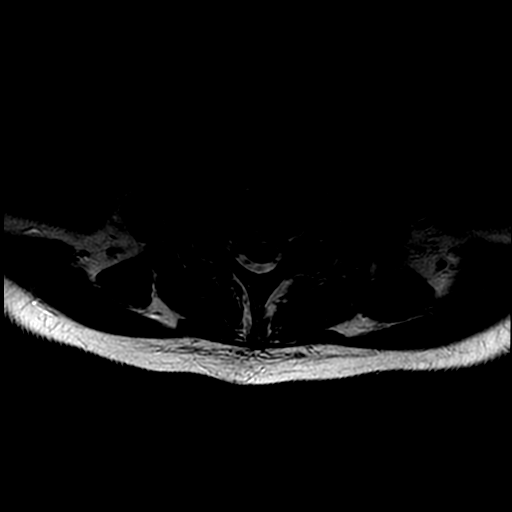
[im 9/27]
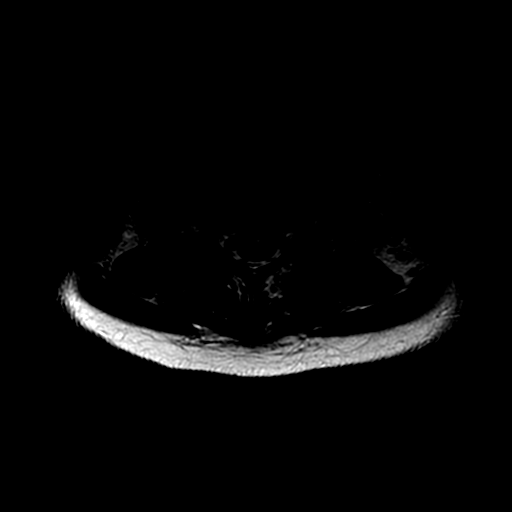
[im 13/27]
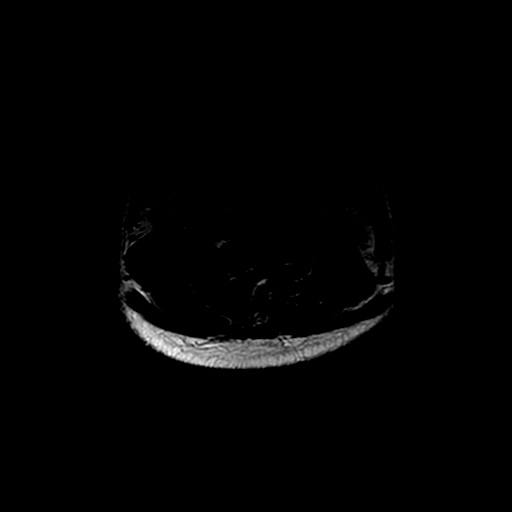
[im 15/27]
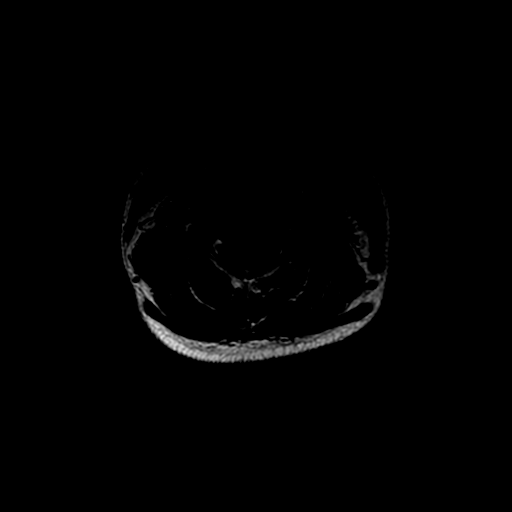
[im 19/27]
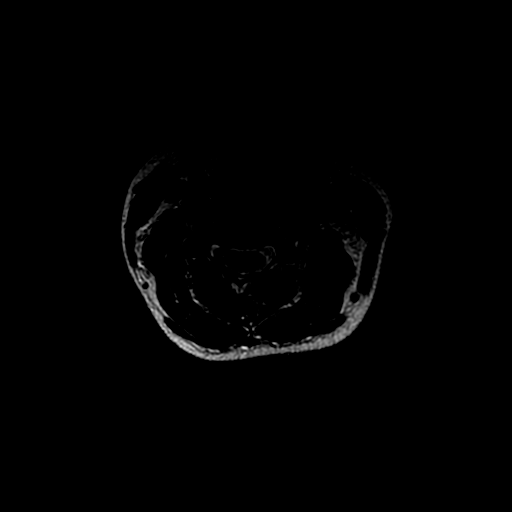
[im 23/27]
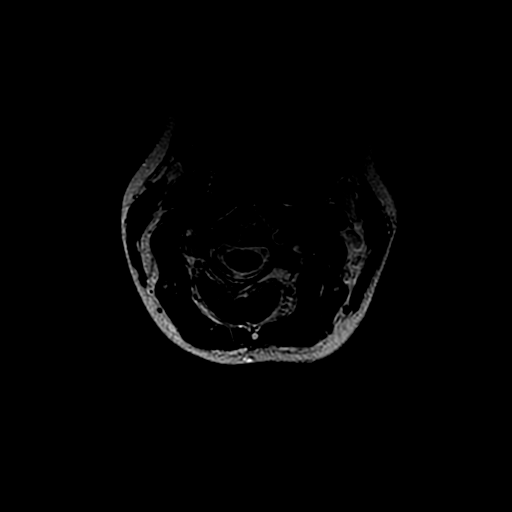

[19 of 48 positions shown; findings below may reference images not displayed]

FINDINGS: Alignment: Stable.

Vertebrae: Stable vertebral body heights. No substantial marrow
edema. No suspicious osseous lesion.

Cord: No abnormal signal.

Posterior Fossa, vertebral arteries, paraspinal tissues:
Unremarkable.

Disc levels:

C2-C3:  No stenosis.

C3-C4:  Minimal disc bulge.  No progressed stenosis.

C4-C5: Minimal disc bulge with superimposed left paracentral
extrusion with endplate osteophytes. Probable left C5 nerve root
compression. Remains narrowing of the inferior left neural foramen.
No progressive canal or foraminal stenosis.

C5-C6: Minimal disc bulge with superimposed right paracentral
protrusion with endplate osteophytes. Remains minor canal and right
foraminal stenosis. No left foraminal stenosis.

C6-C7:  No canal or foraminal stenosis.

C7-T1:  No canal or foraminal stenosis.
IMPRESSION: No substantial change since 06/28/2021. As before there is a disc
herniation at C4-C5 with potential left C5 nerve root compression.

## 2022-02-13 IMAGING — RF DG CERVICAL SPINE 2 OR 3 VIEWS
1 series · 3 of 3 positions shown · non-contrast
Comparison: MRI cervical spine from yesterday.

CLINICAL DATA: C4-C5 disc arthroplasty.

EXAM:
CERVICAL SPINE - 2-3 VIEW

[Series 1: run · 3 of 3 slices shown]
[im 1/3]
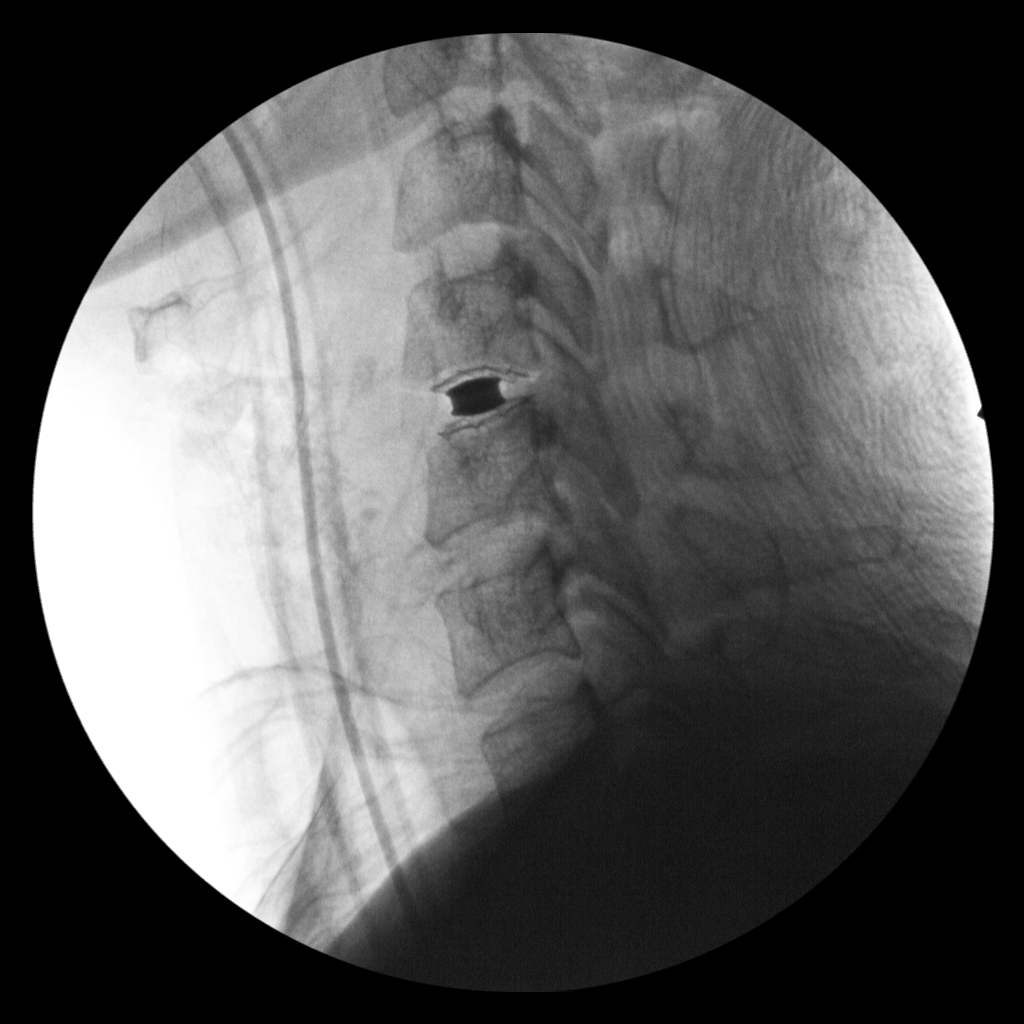
[im 2/3]
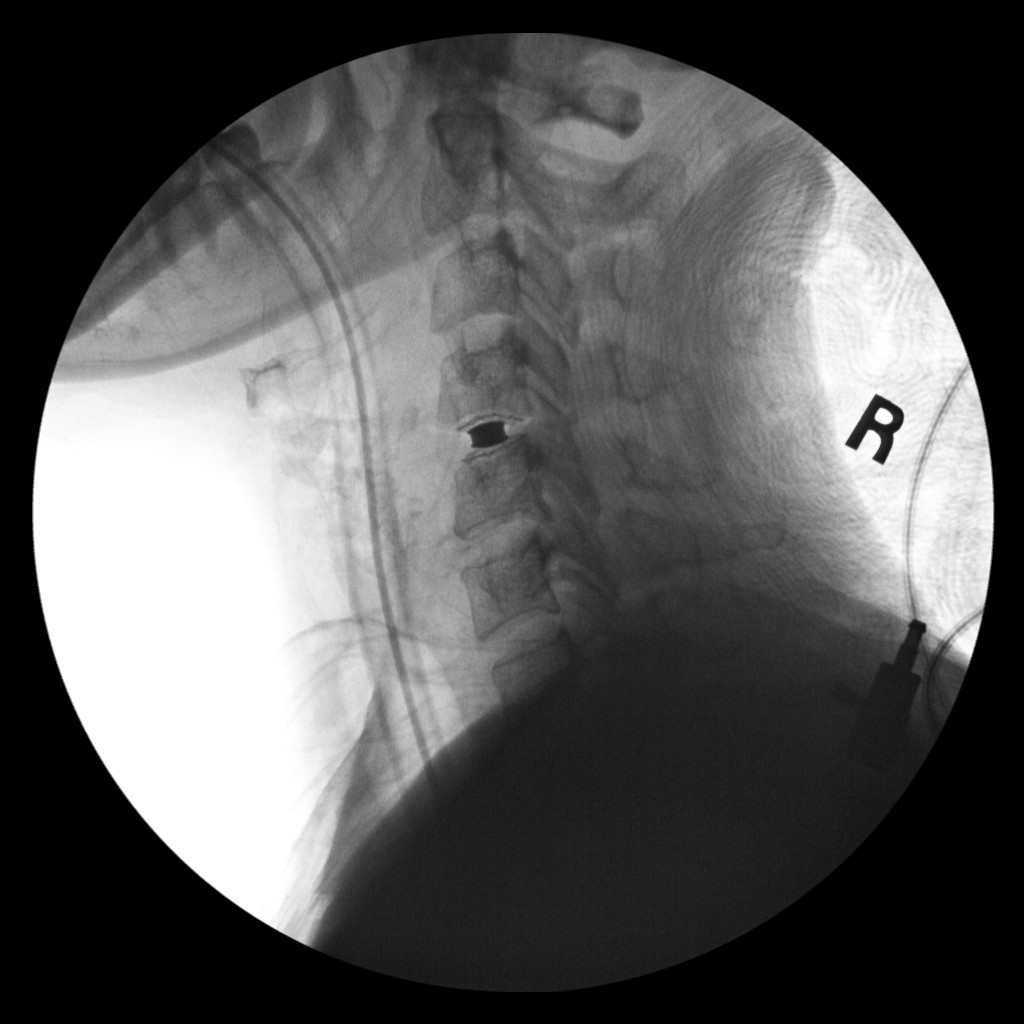
[im 3/3]
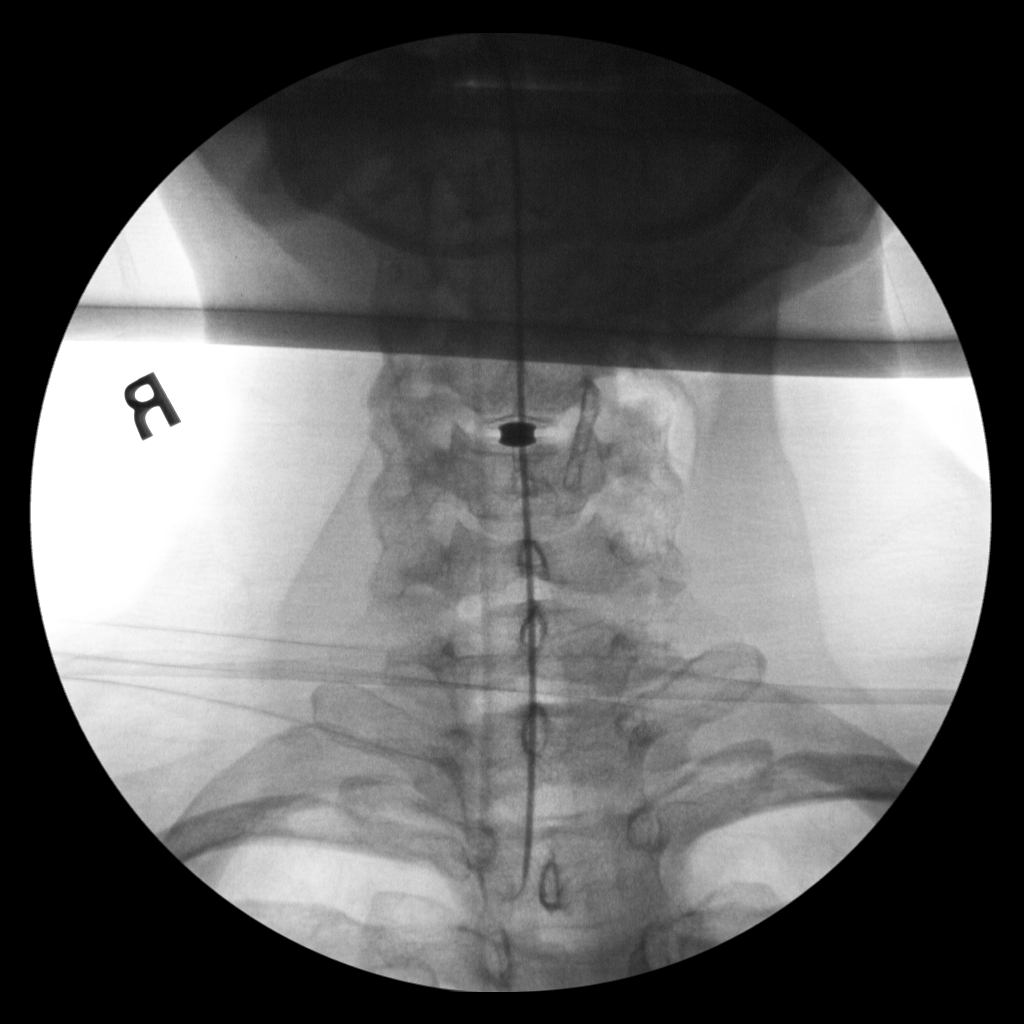

[3 of 3 positions shown; findings below may reference images not displayed]

FLUOROSCOPY TIME:  Radiation Exposure Index (as provided by the
fluoroscopic device): 11.06 mGy Kerma

C-arm fluoroscopic images were obtained intraoperatively and
submitted for post operative interpretation.
FINDINGS: Frontal and lateral intraoperative fluoroscopic images demonstrate
interval C4-C5 disc arthroplasty, appropriately centered within the
disc space. Remaining disc heights are relatively preserved. No
acute osseous abnormality.
IMPRESSION: Intraoperative fluoroscopic guidance for C4-C5 disc arthroplasty.

## 2022-05-13 ENCOUNTER — Encounter: Payer: Self-pay | Admitting: Radiology

## 2022-05-13 ENCOUNTER — Ambulatory Visit: Payer: No Typology Code available for payment source | Admitting: Radiology

## 2022-05-13 VITALS — BP 108/74 | Ht 66.0 in | Wt 113.0 lb

## 2022-05-13 DIAGNOSIS — N939 Abnormal uterine and vaginal bleeding, unspecified: Secondary | ICD-10-CM

## 2022-05-13 DIAGNOSIS — R102 Pelvic and perineal pain: Secondary | ICD-10-CM | POA: Diagnosis not present

## 2022-05-13 NOTE — Progress Notes (Signed)
   NYX KEADY 17-Jun-1986 [id number]   History:  35 y.o. Alexis [name] presents as a new patient with c/o pelvic pain and spotting between periods x 2 months. Denies any abnormal d/c, urinary or bowel changes. She has been separated since April. Had a negative STI screen since and has not been sexually active per pt. She has lost 20lbs due to stress, not eating well, not in therapy, no medicated for her Bipolar disorder currently.   Gynecologic History Patient's last menstrual period was 05/06/2022 (exact date). Period Cycle (Days): 28 Period Duration (Days): 6 Period Pattern: Regular Menstrual Flow: Heavy Menstrual Control: Maxi pad Dysmenorrhea: (!) Moderate Dysmenorrhea Symptoms: Cramping Contraception/Family planning: tubal ligation Sexually active: no Last Pap: 2015. Results were: normal   Obstetric History OB History  Gravida Para Term Preterm AB Living  3 3 3  0 0 3  SAB IAB Ectopic Multiple Live Births  0       1    # Outcome Date GA Lbr Len/2nd Weight Sex Delivery Anes PTL Lv  3 Term 02/17/12 [redacted]w[redacted]d 14:44 / 00:05 7 lb 2.3 oz (3.24 kg) F Vag-Spont EPI  LIV  2 Term           1 Term              The following portions of the patient's history were reviewed and updated as appropriate: allergies, current medications, past family history, past medical history, past social history, past surgical history, and problem list.  Review of Systems Pertinent items noted in HPI and remainder of comprehensive ROS otherwise negative.   Past medical history, past surgical history, family history and social history were all reviewed and documented in the EPIC chart.   Exam:  Vitals:   05/13/22 0806  BP: 108/74  Weight: 113 lb (51.3 kg)  Height: 5\' 6"  (1.676 m)   Body mass index is 18.24 kg/m.  General appearance:  Normal Abdominal  Soft,nontender, without masses, guarding or rebound.  Liver/spleen:  No organomegaly noted  Hernia:  None appreciated  Skin  Inspection:   Grossly normal Genitourinary   Inguinal/mons:  Normal without inguinal adenopathy  External genitalia:  Normal appearing vulva with no masses, tenderness, or lesions  BUS/Urethra/Skene's glands:  Normal without masses or exudate  Vagina:  Normal appearing with normal color and discharge, no lesions  Cervix:  Normal appearing without discharge or lesions  Uterus:  Normal in size, shape and contour.  Mobile, nontender  Adnexa/parametria:     Rt: Normal in size, without masses or tenderness.   Lt: Normal in size, without masses or tenderness.  Anus and perineum: Normal  Chaperone offered and declines  Assessment/Plan:   1. Abnormal vaginal bleeding - Pregnancy, urine - 05/15/22 Transvaginal Non-OB; Future  2. Pelvic pain - Urinalysis,Complete w/RFL Culture - Transvaginal Non-OB; Future   [place] B WHNP-BC 8:26 AM 05/13/2022

## 2022-05-15 LAB — URINALYSIS, COMPLETE W/RFL CULTURE
Bilirubin Urine: NEGATIVE
Glucose, UA: NEGATIVE
Hyaline Cast: NONE SEEN /LPF
Nitrites, Initial: NEGATIVE
Specific Gravity, Urine: 1.024 (ref 1.001–1.035)
pH: 5 (ref 5.0–8.0)

## 2022-05-15 LAB — URINE CULTURE
MICRO NUMBER:: 14192464
Result:: NO GROWTH
SPECIMEN QUALITY:: ADEQUATE

## 2022-05-15 LAB — PREGNANCY, URINE: Preg Test, Ur: NEGATIVE

## 2022-05-15 LAB — CULTURE INDICATED

## 2022-06-02 ENCOUNTER — Other Ambulatory Visit: Payer: No Typology Code available for payment source

## 2022-06-02 ENCOUNTER — Other Ambulatory Visit: Payer: No Typology Code available for payment source | Admitting: Radiology

## 2022-06-16 ENCOUNTER — Encounter: Payer: Self-pay | Admitting: Radiology

## 2022-06-16 ENCOUNTER — Ambulatory Visit (INDEPENDENT_AMBULATORY_CARE_PROVIDER_SITE_OTHER): Payer: No Typology Code available for payment source | Admitting: Radiology

## 2022-06-16 ENCOUNTER — Other Ambulatory Visit (HOSPITAL_COMMUNITY)
Admission: RE | Admit: 2022-06-16 | Discharge: 2022-06-16 | Disposition: A | Discharge status: Home / Self Care | Payer: No Typology Code available for payment source | Source: Ambulatory Visit | Attending: Radiology | Admitting: Radiology

## 2022-06-16 VITALS — BP 114/72 | Ht 66.0 in | Wt 113.0 lb

## 2022-06-16 DIAGNOSIS — Z113 Encounter for screening for infections with a predominantly sexual mode of transmission: Secondary | ICD-10-CM | POA: Insufficient documentation

## 2022-06-16 DIAGNOSIS — Z01419 Encounter for gynecological examination (general) (routine) without abnormal findings: Secondary | ICD-10-CM | POA: Insufficient documentation

## 2022-06-16 NOTE — Progress Notes (Signed)
   Alexis [name] [birth year] [id number]   History:  36 y.o. G3P3 presents for annual exam. Has appt scheduled for u/s next month to evaluate pelvic pain, little change in symptoms from last visit. Would like STI screen, had sex with ex husband last week no has d/c.  Gynecologic History Patient's last menstrual period was 06/01/2022 (exact date). Period Duration (Days): 7 Period Pattern: (!) Irregular Menstrual Flow: Heavy Menstrual Control: Maxi pad Dysmenorrhea: (!) Severe Dysmenorrhea Symptoms: Cramping Contraception/Family planning: tubal ligation Sexually active: yes Last Pap: 2015. Results were: normal   Obstetric History OB History  Gravida Para Term Preterm AB Living  3 3 3  0 0 3  SAB IAB Ectopic Multiple Live Births  0       3    # Outcome Date GA Lbr Len/2nd Weight Sex Delivery Anes PTL Lv  3 Term 02/17/12 [redacted]w[redacted]d 14:44 / 00:05 7 lb 2.3 oz (3.24 kg) F Vag-Spont EPI  LIV  2 Term           1 Term              The following portions of the patient's history were reviewed and updated as appropriate: allergies, current medications, past family history, past medical history, past social history, past surgical history, and problem list.  Review of Systems Pertinent items noted in HPI and remainder of comprehensive ROS otherwise negative.   Past medical history, past surgical history, family history and social history were all reviewed and documented in the EPIC chart.   Exam:  Vitals:   06/16/22 0758  BP: 114/72  Weight: 113 lb (51.3 kg)  Height: 5\' 6"  (1.676 m)   Body mass index is 18.24 kg/m.  General appearance:  Normal Thyroid:  Symmetrical, normal in size, without palpable masses or nodularity. Respiratory  Auscultation:  Clear without wheezing or rhonchi Cardiovascular  Auscultation:  Regular rate, without rubs, murmurs or gallops  Edema/varicosities:  Not grossly evident Abdominal  Soft,nontender, without masses, guarding or  rebound.  Liver/spleen:  No organomegaly noted  Hernia:  None appreciated  Skin  Inspection:  Grossly normal Breasts: Examined lying and sitting.   Right: Without masses, retractions, nipple discharge or axillary adenopathy.   Left: Without masses, retractions, nipple discharge or axillary adenopathy. Genitourinary   Inguinal/mons:  Normal without inguinal adenopathy  External genitalia:  Normal appearing vulva with no masses, tenderness, or lesions  BUS/Urethra/Skene's glands:  Normal without masses or exudate  Vagina:  Normal appearing with normal color and discharge, no lesions  Cervix:  Normal appearing without discharge or lesions  Uterus:  Normal in size, shape and contour.  Mobile, nontender  Adnexa/parametria:     Rt: Normal in size, without masses or tenderness.   Lt: Normal in size, without masses or tenderness.  Anus and perineum: Normal   Patient informed chaperone available to be present for breast and pelvic exam. Patient has requested no chaperone to be present. Patient has been advised what will be completed during breast and pelvic exam.   Assessment/Plan:   1. Well woman exam with routine gynecological exam  - Cytology - PAP( [place])  2. Screening for STDs (sexually transmitted diseases)  - Cytology - PAP( [place])     Discussed SBE, colonoscopy and DEXA screening as directed/appropriate. Recommend 06/18/22 of exercise weekly, including weight bearing exercise. Encouraged the use of seatbelts and sunscreen. Return in 1 year for annual or as needed.   B WHNP-BC 8:18 AM 06/16/2022

## 2022-06-18 LAB — CYTOLOGY - PAP
Chlamydia: NEGATIVE
Comment: NEGATIVE
Comment: NEGATIVE
Comment: NEGATIVE
Comment: NORMAL
Diagnosis: NEGATIVE
High risk HPV: NEGATIVE
Neisseria Gonorrhea: NEGATIVE
Trichomonas: NEGATIVE

## 2022-06-19 ENCOUNTER — Other Ambulatory Visit: Payer: Self-pay

## 2022-06-19 ENCOUNTER — Encounter: Payer: No Typology Code available for payment source | Admitting: Radiology

## 2022-06-19 DIAGNOSIS — B379 Candidiasis, unspecified: Secondary | ICD-10-CM

## 2022-06-19 MED ORDER — FLUCONAZOLE 150 MG PO TABS: ORAL_TABLET | ORAL | 0 refills | Status: DC

## 2022-07-16 ENCOUNTER — Other Ambulatory Visit: Payer: No Typology Code available for payment source | Admitting: Radiology

## 2022-07-16 ENCOUNTER — Other Ambulatory Visit: Payer: No Typology Code available for payment source

## 2022-11-26 ENCOUNTER — Telehealth: Payer: Self-pay | Admitting: Physician Assistant

## 2022-11-26 DIAGNOSIS — R3989 Other symptoms and signs involving the genitourinary system: Secondary | ICD-10-CM

## 2022-11-26 MED ORDER — CEPHALEXIN 500 MG PO CAPS: 500.0000 mg | ORAL_CAPSULE | Freq: Two times a day (BID) | ORAL | 0 refills | Status: AC

## 2022-11-26 MED ORDER — FLUCONAZOLE 150 MG PO TABS: 150.0000 mg | ORAL_TABLET | Freq: Once | ORAL | 0 refills | Status: AC

## 2022-11-26 NOTE — Progress Notes (Signed)

## 2022-11-26 NOTE — Progress Notes (Signed)
I have spent 5 minutes in review of e-visit questionnaire, review and updating patient chart, medical decision making and response to patient.   Jenniefer Salak Cody Makela Niehoff, PA-C    

## 2022-11-26 NOTE — Addendum Note (Signed)
Addended by: Freddy Finner on: 11/26/2022 11:11 AM   Modules accepted: Orders

## 2022-12-22 ENCOUNTER — Telehealth: Payer: Self-pay | Admitting: Family Medicine

## 2022-12-22 DIAGNOSIS — N898 Other specified noninflammatory disorders of vagina: Secondary | ICD-10-CM

## 2022-12-22 DIAGNOSIS — R102 Pelvic and perineal pain: Secondary | ICD-10-CM

## 2022-12-22 NOTE — Progress Notes (Signed)
Because pain with intercourse and new sexual partner you should be tested so we can provided best treatment, I feel your condition warrants further evaluation and I recommend that you be seen in a face to face visit at an office.   NOTE: There will be NO CHARGE for this eVisit   If you are having a true medical emergency please call 911.

## 2023-01-04 ENCOUNTER — Ambulatory Visit: Payer: 59 | Admitting: Radiology

## 2023-01-19 ENCOUNTER — Ambulatory Visit: Payer: 59 | Admitting: Radiology

## 2023-05-13 ENCOUNTER — Telehealth: Payer: 59 | Admitting: Physician Assistant

## 2023-05-13 DIAGNOSIS — B3731 Acute candidiasis of vulva and vagina: Secondary | ICD-10-CM | POA: Diagnosis not present

## 2023-05-13 MED ORDER — FLUCONAZOLE 150 MG PO TABS: ORAL_TABLET | ORAL | 0 refills | Status: DC

## 2023-05-13 NOTE — Progress Notes (Signed)
I have spent 5 minutes in review of e-visit questionnaire, review and updating patient chart, medical decision making and response to patient.   Mia Milan Cody Jacklynn Dehaas, PA-C    

## 2023-05-13 NOTE — Progress Notes (Signed)

## 2023-09-08 ENCOUNTER — Telehealth: Admitting: Physician Assistant

## 2023-09-08 DIAGNOSIS — N760 Acute vaginitis: Secondary | ICD-10-CM | POA: Diagnosis not present

## 2023-09-08 DIAGNOSIS — T3695XA Adverse effect of unspecified systemic antibiotic, initial encounter: Secondary | ICD-10-CM

## 2023-09-08 DIAGNOSIS — B379 Candidiasis, unspecified: Secondary | ICD-10-CM

## 2023-09-08 DIAGNOSIS — B9689 Other specified bacterial agents as the cause of diseases classified elsewhere: Secondary | ICD-10-CM

## 2023-09-08 MED ORDER — METRONIDAZOLE 500 MG PO TABS
500.0000 mg | ORAL_TABLET | Freq: Two times a day (BID) | ORAL | 0 refills | Status: DC
Start: 2023-09-08 — End: 2023-12-01

## 2023-09-08 MED ORDER — FLUCONAZOLE 150 MG PO TABS
ORAL_TABLET | ORAL | 0 refills | Status: DC
Start: 2023-09-08 — End: 2023-12-01

## 2023-09-08 NOTE — Progress Notes (Signed)

## 2023-09-08 NOTE — Progress Notes (Signed)
 I have spent 5 minutes in review of e-visit questionnaire, review and updating patient chart, medical decision making and response to patient.   Piedad Climes, PA-C

## 2023-09-17 ENCOUNTER — Ambulatory Visit: Payer: 59 | Admitting: Radiology

## 2023-10-07 ENCOUNTER — Telehealth: Payer: Self-pay | Admitting: *Deleted

## 2023-10-07 NOTE — Telephone Encounter (Signed)
 I recommend she have these labs done with PCP. Will check for vaginal yeast at her visit.

## 2023-10-07 NOTE — Telephone Encounter (Signed)
 Call returned to patient, no answer, mailbox full unable to leave a message.   MyChart message to patient.

## 2023-10-07 NOTE — Telephone Encounter (Signed)
 Spoke with patient, scheduled for AEX 10/27/23.   Patient states she has been experiencing recurrent yeast infections over the last 3-4 months and dry mouth. States she drinks a lot of water and water is starting to have a sugar taste. Increase in headaches and fatigue. States DM runs in her family.   Patient requesting labs to be checked at her upcoming AEX. Patient states she has not seen her PCP.   Denies any other symptoms.   Advised will have Jami review and advise on labs for upcoming appt.   Routing to [place] to review.

## 2023-10-27 ENCOUNTER — Ambulatory Visit: Admitting: Radiology

## 2023-11-09 ENCOUNTER — Ambulatory Visit: Admitting: Radiology

## 2023-12-01 ENCOUNTER — Telehealth: Admitting: Physician Assistant

## 2023-12-01 DIAGNOSIS — B3731 Acute candidiasis of vulva and vagina: Secondary | ICD-10-CM | POA: Diagnosis not present

## 2023-12-01 MED ORDER — FLUCONAZOLE 150 MG PO TABS
150.0000 mg | ORAL_TABLET | ORAL | 0 refills | Status: DC | PRN
Start: 2023-12-01 — End: 2024-01-10

## 2023-12-01 NOTE — Progress Notes (Signed)

## 2023-12-01 NOTE — Telephone Encounter (Signed)
 Alexis [name] to Me (Selected Message) DM   12/01/23  3:43 PM Patient has been contacted and appointment has been made and her contact # has been updated.

## 2023-12-08 ENCOUNTER — Ambulatory Visit: Admitting: Radiology

## 2024-01-10 ENCOUNTER — Telehealth: Admitting: Physician Assistant

## 2024-01-10 DIAGNOSIS — B3731 Acute candidiasis of vulva and vagina: Secondary | ICD-10-CM | POA: Diagnosis not present

## 2024-01-10 MED ORDER — FLUCONAZOLE 150 MG PO TABS
150.0000 mg | ORAL_TABLET | ORAL | 0 refills | Status: DC | PRN
Start: 2024-01-10 — End: 2024-03-31

## 2024-01-10 NOTE — Progress Notes (Signed)

## 2024-01-11 ENCOUNTER — Encounter: Payer: Self-pay | Admitting: Radiology

## 2024-01-11 ENCOUNTER — Ambulatory Visit (INDEPENDENT_AMBULATORY_CARE_PROVIDER_SITE_OTHER): Admitting: Radiology

## 2024-01-11 VITALS — BP 112/70 | Ht 66.5 in | Wt 124.0 lb

## 2024-01-11 DIAGNOSIS — Z01419 Encounter for gynecological examination (general) (routine) without abnormal findings: Secondary | ICD-10-CM | POA: Diagnosis not present

## 2024-01-11 DIAGNOSIS — Z1331 Encounter for screening for depression: Secondary | ICD-10-CM | POA: Diagnosis not present

## 2024-01-11 DIAGNOSIS — N760 Acute vaginitis: Secondary | ICD-10-CM | POA: Diagnosis not present

## 2024-01-11 NOTE — Progress Notes (Signed)
   Alexis [name] 1985/12/17 [id number]   History:  38 y.o. G3P3 presents for annual exam. C/o recurrent vaginal infections, often after her period. Negative STI screen at urgent care. Not currently sexually active.  Gynecologic History Patient's last menstrual period was 12/17/2023 (approximate). Period Cycle (Days): 28 Period Duration (Days): 7-8 Period Pattern: Regular Menstrual Flow: Heavy Menstrual Control: Tampon Dysmenorrhea: (!) Severe Dysmenorrhea Symptoms: Cramping Contraception/Family planning: tubal ligation Sexually active: yes Last Pap: 2023. Results were: normal   Obstetric History OB History  Gravida Para Term Preterm AB Living  3 3 3  0 0 3  SAB IAB Ectopic Multiple Live Births  0    3    # Outcome Date GA Lbr Len/2nd Weight Sex Type Anes PTL Lv  3 Term 02/17/12 [redacted]w[redacted]d 14:44 / 00:05 7 lb 2.3 oz (3.24 kg) F Vag-Spont EPI  LIV  2 Term           1 Term               01/11/2024    3:49 PM  Depression screen PHQ 2/9  Decreased Interest 0  Down, Depressed, Hopeless 0  PHQ - 2 Score 0     The following portions of the patient's history were reviewed and updated as appropriate: allergies, current medications, past family history, past medical history, past social history, past surgical history, and problem list.  Review of Systems Pertinent items noted in HPI and remainder of comprehensive ROS otherwise negative.   Past medical history, past surgical history, family history and social history were all reviewed and documented in the EPIC chart.   Exam:  Vitals:   01/11/24 1548  BP: 112/70  Weight: 124 lb (56.2 kg)  Height: 5' 6.5 (1.689 m)    Body mass index is 19.71 kg/m.  General appearance:  Normal Thyroid :  Symmetrical, normal in size, without palpable masses or nodularity. Respiratory  Auscultation:  Clear without wheezing or rhonchi Cardiovascular  Auscultation:  Regular rate, without rubs, murmurs or gallops  Edema/varicosities:  Not  grossly evident Abdominal  Soft,nontender, without masses, guarding or rebound.  Liver/spleen:  No organomegaly noted  Hernia:  None appreciated  Skin  Inspection:  Grossly normal Breasts: Examined lying and sitting. Bilateral implants.  Right: Without masses, retractions, nipple discharge or axillary adenopathy.   Left: Without masses, retractions, nipple discharge or axillary adenopathy. Genitourinary   Inguinal/mons:  Normal without inguinal adenopathy  External genitalia:  Normal appearing vulva with no masses, tenderness, or lesions  BUS/Urethra/Skene's glands:  Normal without masses or exudate  Vagina:  Normal appearing with normal color, no lesions, scant white discharge.  Cervix:  Normal appearing without discharge or lesions  Uterus:  Normal in size, shape and contour.  Mobile, nontender  Adnexa/parametria:     Rt: Normal in size, without masses or tenderness.   Lt: Normal in size, without masses or tenderness.  Anus and perineum: Normal   Alexis [name], CMA present for exam  Assessment/Plan:   1. Well woman exam with routine gynecological exam (Primary) Pap 2026 Mammo at 41  2. Depression screen negative  3. Recurrent vaginitis - SureSwab Advanced Candida Vaginitis (CV), TMA   Return in 1 year for annual or as needed.   Alexis [name] B WHNP-BC 4:11 PM 01/11/2024

## 2024-01-12 LAB — SURESWAB® ADVANCED CANDIDA VAGINITIS (CV), TMA
CANDIDA SPECIES: DETECTED — AB
Candida glabrata: NOT DETECTED

## 2024-01-13 ENCOUNTER — Ambulatory Visit: Payer: Self-pay | Admitting: Radiology

## 2024-03-31 ENCOUNTER — Encounter: Payer: Self-pay | Admitting: Student in an Organized Health Care Education/Training Program

## 2024-03-31 ENCOUNTER — Ambulatory Visit: Admitting: Student in an Organized Health Care Education/Training Program

## 2024-03-31 VITALS — BP 120/63 | HR 60 | Ht 66.5 in | Wt 130.0 lb

## 2024-03-31 DIAGNOSIS — R251 Tremor, unspecified: Secondary | ICD-10-CM | POA: Insufficient documentation

## 2024-03-31 LAB — COMPREHENSIVE METABOLIC PANEL WITH GFR
ALT: 13 U/L (ref 0–35)
AST: 15 U/L (ref 0–37)
Albumin: 4.6 g/dL (ref 3.5–5.2)
Alkaline Phosphatase: 47 U/L (ref 39–117)
BUN: 8 mg/dL (ref 6–23)
CO2: 27 meq/L (ref 19–32)
Calcium: 9.6 mg/dL (ref 8.4–10.5)
Chloride: 103 meq/L (ref 96–112)
Creatinine, Ser: 0.73 mg/dL (ref 0.40–1.20)
GFR: 105.4 mL/min (ref >60.00)
Glucose, Bld: 95 mg/dL (ref 70–99)
Potassium: 4.5 meq/L (ref 3.5–5.1)
Sodium: 140 meq/L (ref 135–145)
Total Bilirubin: 0.4 mg/dL (ref 0.2–1.2)
Total Protein: 7 g/dL (ref 6.0–8.3)

## 2024-03-31 LAB — TSH: TSH: 1.33 u[IU]/mL (ref 0.35–5.50)

## 2024-03-31 LAB — MAGNESIUM: Magnesium: 2 mg/dL (ref 1.5–2.5)

## 2024-03-31 MED ORDER — PROPRANOLOL HCL 10 MG PO TABS: 10.0000 mg | ORAL_TABLET | Freq: Two times a day (BID) | ORAL | 2 refills | Status: AC | PRN

## 2024-03-31 NOTE — Patient Instructions (Addendum)
   VISIT SUMMARY: Today, you came in for a routine wellness visit and to discuss the hand tremors you've been experiencing for the past six months. Your blood pressure and weight are normal, and there are no acute concerns from the wellness check. We also reviewed your medical history, including your previous cervical disc surgery, and discussed your family history of Parkinson's disease and thyroid  issues.  YOUR PLAN: -ADULT WELLNESS VISIT: This was a routine check-up to ensure your overall health is on track. Your blood pressure and weight are normal. We will continue to monitor your health with annual wellness visits and address any acute concerns as they arise.  -HAND TREMOR: Hand tremors are involuntary shaking movements of the hands. Your tremors have been occurring intermittently for six months, especially during activities requiring steady hands. We do not see signs of Parkinson's disease or thyroid  issues, but stress and anxiety may be contributing factors. We will conduct blood tests to check your thyroid  function, electrolytes, kidney function, liver enzymes, and blood counts. You have been prescribed propranolol to help manage the tremors, especially during your hospital work. Please try the medication at home first to see if you experience any side effects like lightheadedness, slow heart rate, or fatigue.  INSTRUCTIONS: Please schedule your annual wellness visit and come in for acute care visits as needed. Additionally, follow up with the blood work we ordered to check your thyroid  function, electrolytes, kidney function, liver enzymes, and blood counts. Try the propranolol at home to monitor for any side effects before using it during work.

## 2024-03-31 NOTE — Progress Notes (Signed)
 New Patient Office Visit  Subjective    Patient ID: Alexis [name], female    DOB: July 21, 1985  Age: 38 y.o. MRN: [id number]  CC:  Chief Complaint  Patient presents with   Establish Care   Tremors    Both hands and more in the right. Would like blood work done to see the cause. Sx started 6 months ago. Getting worse    HPI  Discussed the use of AI scribe software for clinical note transcription with the patient, who gave verbal consent to proceed.  History of Present Illness Alexis [name] is a 38 year old female who presents with hand tremors persisting for six months. She is accompanied by her daughter.  She has been experiencing hand tremors intermittently throughout the day for the past six months. 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 She does not report any weakness, numbness, or tingling in her hands.  Her medical history includes a cervical disc herniation at C4-C5 with left C5 radiculopathy, treated with total disc arthroplasty in February 2023. She reports no ongoing issues related to this surgery, including no pain, numbness, or weakness in her hands.  There is no family history of similar tremors, but there is a history of Parkinson's disease on her maternal side and thyroid  problems affecting her father and sister.  She consumes one cup of coffee daily and drinks water for the rest of the day. She has no current nicotine  use. She does not take any medications or supplements currently.  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 She is a mother of three children, aged 37, 92, and 61, and recently moved to [place] following a divorce two years ago. She reports ongoing stress and occasional anxiety but denies current depression.    Outpatient Encounter Medications as of 03/31/2024  Medication Sig   propranolol  (INDERAL) 10 MG tablet Take 1 tablet (10 mg total) by mouth 2 (two) times daily as needed (tremor).   [DISCONTINUED] fluconazole  (DIFLUCAN ) 150 MG tablet Take 1 tablet (150 mg total) by mouth every 3 (three) days as needed. (Patient not taking: Reported on 03/31/2024)   No facility-administered encounter medications on file as of 03/31/2024.    Past Medical History:  Diagnosis Date   Anxiety    Bipolar disorder (HCC)    Cervical disc herniation 07/30/2021   Cervical radiculopathy 06/29/2021   Depression    Generalized anxiety disorder 08/29/2012   Adequate for discharge     H/O breast augmentation 05/30/2014   Major depressive disorder, recurrent episode, moderate (HCC) 08/29/2012   Adequate for discharge     No pertinent past medical history    OCD (obsessive compulsive disorder)     Past Surgical History:  Procedure Laterality Date   BREAST ENHANCEMENT SURGERY Bilateral    CERVICAL DISC ARTHROPLASTY N/A 07/31/2021   Procedure: CERVICAL FOUR  THROUGH FIVE ANTERIOR DISC ARTHROPLASTY;  Surgeon: Burnetta Aures, MD;  Location: [facility] OR;  Service: Orthopedics;  Laterality: N/A;   TUBAL LIGATION  02/18/2012   Procedure: POST PARTUM TUBAL LIGATION;  Surgeon: Harland JAYSON Birkenhead, MD;  Location: [facility]S;  Service: Gynecology;  Laterality: Bilateral;    Family History  Problem Relation Age of Onset   Bipolar disorder Brother        Objective    BP 120/63 (BP Location: Right Arm, Patient Position:  Sitting, Cuff Size: Normal)   Pulse 60   Ht 5' 6.5 (1.689 m)   Wt 130 lb (59 kg)   BMI 20.67 kg/m   Physical Exam  Gen: Well appearing woman Neuro: Alert, conversational, full strength in the upper and lower extremities, normal cranial nerves, her hands have a high-frequency low amplitude tremor bilaterally that is mild to moderate.  There is no tremor affecting the face or voice.  Normal finger-to-nose testing.  There is no resting tremor.  No masked face.  Normal gait and balance.  Reflexes are  normal in brachial and patella. Neck: Normal thyroid , no nodules or adenopathy Heart: Regular, no murmur Lungs: Unlabored, clear throughout Abd: Soft, nontender, no organomegaly Ext: Warm, no edema, normal joints Psych: Appropriate mood and affect, not anxious or depressed appearing     Assessment & Plan:   Problem List Items Addressed This Visit       High   Excessive physiologic tremor - Primary (Chronic)   Mild to moderate burden of high-frequency, low amplitude tremor in bilateral hands.  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  Neuroexam is otherwise normal.  No recent medication triggers, no excessive caffeine use, no nicotine  use.  Only potential trigger I can identify is increased stress.  Her mood overall is doing really well, but she does have some ongoing issues with anxiety and some external stressors from divorce 2 years ago.  No family history of essential tremor.  No signs of parkinsonism on exam today.  We talked about drawing blood work to rule out metabolic disturbance or hyperthyroidism.  We talked about supportive care for the physiologic tremor, it is possible this will resolve spontaneously in the coming months especially if stress levels are improved.  I recommended increased exercise, healthy sleep.  She is doing a good job with her health otherwise.  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  We talked about the risks of propranolol, specially orthostatic hypotension symptoms, so she is going to try this at home first before trying it at work.      Relevant Medications   propranolol (INDERAL) 10 MG tablet   Other Relevant Orders   Comprehensive metabolic panel with GFR   TSH   Magnesium     Return in about 1 year  (around 03/31/2025).   Cleatus Debby Specking, MD

## 2024-03-31 NOTE — Assessment & Plan Note (Signed)
 Mild to moderate burden of high-frequency, low amplitude tremor in bilateral hands.  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  Neuroexam is otherwise normal.  No recent medication triggers, no excessive caffeine use, no nicotine  use.  Only potential trigger I can identify is increased stress.  Her mood overall is doing really well, but she does have some ongoing issues with anxiety and some external stressors from divorce 2 years ago.  No family history of essential tremor.  No signs of parkinsonism on exam today.  We talked about drawing blood work to rule out metabolic disturbance or hyperthyroidism.  We talked about supportive care for the physiologic tremor, it is possible this will resolve spontaneously in the coming months especially if stress levels are improved.  I recommended increased exercise, healthy sleep.  She is doing a good job with her health otherwise.  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  We talked about the risks of propranolol, specially orthostatic hypotension symptoms, so she is going to try this at home first before trying it at work.

## 2024-04-03 ENCOUNTER — Telehealth: Admitting: Physician Assistant

## 2024-04-03 ENCOUNTER — Ambulatory Visit: Payer: Self-pay | Admitting: Student in an Organized Health Care Education/Training Program

## 2024-04-03 DIAGNOSIS — B3731 Acute candidiasis of vulva and vagina: Secondary | ICD-10-CM | POA: Diagnosis not present

## 2024-04-03 MED ORDER — FLUCONAZOLE 150 MG PO TABS: 150.0000 mg | ORAL_TABLET | Freq: Once | ORAL | 0 refills | Status: AC

## 2024-04-03 NOTE — Progress Notes (Signed)

## 2024-04-07 ENCOUNTER — Other Ambulatory Visit: Payer: Self-pay | Admitting: Student in an Organized Health Care Education/Training Program

## 2024-04-07 DIAGNOSIS — R251 Tremor, unspecified: Secondary | ICD-10-CM

## 2024-04-07 NOTE — Telephone Encounter (Signed)
 No.  I do not think 180 tablets is necessary.  This is a as needed prescription, not every day.  I suspect the original prescription that I sent will last the patient's 90 days.

## 2024-04-07 NOTE — Telephone Encounter (Signed)
Okay to change to 90 day prescription.

## 2024-07-02 ENCOUNTER — Telehealth

## 2024-07-02 DIAGNOSIS — B3731 Acute candidiasis of vulva and vagina: Secondary | ICD-10-CM | POA: Diagnosis not present

## 2024-07-03 MED ORDER — FLUCONAZOLE 150 MG PO TABS: 150.0000 mg | ORAL_TABLET | ORAL | 0 refills | Status: DC | PRN

## 2024-07-03 NOTE — Progress Notes (Signed)

## 2024-07-06 ENCOUNTER — Telehealth: Admitting: Physician Assistant

## 2024-07-06 DIAGNOSIS — B9689 Other specified bacterial agents as the cause of diseases classified elsewhere: Secondary | ICD-10-CM

## 2024-07-06 MED ORDER — METRONIDAZOLE 500 MG PO TABS: 500.0000 mg | ORAL_TABLET | Freq: Two times a day (BID) | ORAL | 0 refills | Status: DC

## 2024-07-06 NOTE — Progress Notes (Signed)

## 2024-07-21 ENCOUNTER — Telehealth: Admitting: Nurse Practitioner

## 2024-07-21 DIAGNOSIS — B3731 Acute candidiasis of vulva and vagina: Secondary | ICD-10-CM

## 2024-07-21 MED ORDER — FLUCONAZOLE 150 MG PO TABS: 150.0000 mg | ORAL_TABLET | Freq: Once | ORAL | 1 refills | Status: AC

## 2024-07-21 NOTE — Progress Notes (Signed)

## 2024-08-01 ENCOUNTER — Ambulatory Visit: Admitting: Radiology

## 2024-08-01 ENCOUNTER — Encounter: Payer: Self-pay | Admitting: Radiology

## 2024-08-01 VITALS — BP 110/70 | Wt 135.0 lb

## 2024-08-01 DIAGNOSIS — N898 Other specified noninflammatory disorders of vagina: Secondary | ICD-10-CM

## 2024-08-01 DIAGNOSIS — R300 Dysuria: Secondary | ICD-10-CM | POA: Diagnosis not present

## 2024-08-01 LAB — URINALYSIS, COMPLETE W/RFL CULTURE
Bacteria, UA: NONE SEEN /HPF
Bilirubin Urine: NEGATIVE
Glucose, UA: NEGATIVE
Hgb urine dipstick: NEGATIVE
Hyaline Cast: NONE SEEN /LPF
Ketones, ur: NEGATIVE
Leukocyte Esterase: NEGATIVE
Nitrites, Initial: NEGATIVE
Protein, ur: NEGATIVE
RBC / HPF: NONE SEEN /HPF (ref 0–2)
Specific Gravity, Urine: 1.03 (ref 1.001–1.035)
WBC, UA: NONE SEEN /HPF (ref 0–5)
pH: 5.5 (ref 5.0–8.0)

## 2024-08-01 LAB — NO CULTURE INDICATED

## 2024-08-01 LAB — WET PREP FOR TRICH, YEAST, CLUE

## 2024-08-01 MED ORDER — TERCONAZOLE 0.8 % VA CREA: 1.0000 | TOPICAL_CREAM | Freq: Every day | VAGINAL | 0 refills | Status: AC

## 2025-04-02 ENCOUNTER — Encounter: Admitting: Student in an Organized Health Care Education/Training Program
